# Patient Record
Sex: Male | Born: 1960 | Race: White | Hispanic: No | Marital: Married | Smoking: Former smoker
Health system: Southern US, Community
[De-identification: ages and names within clinical notes are randomized; demographics above are authoritative.]

## PROBLEM LIST (undated history)

## (undated) DIAGNOSIS — Z972 Presence of dental prosthetic device (complete) (partial): Secondary | ICD-10-CM

## (undated) DIAGNOSIS — T753XXA Motion sickness, initial encounter: Secondary | ICD-10-CM

## (undated) HISTORY — PX: COLONOSCOPY: SHX174

## (undated) HISTORY — PX: MOUTH SURGERY: SHX715

---

## 2007-02-05 ENCOUNTER — Emergency Department: Payer: Self-pay | Admitting: Emergency Medicine

## 2007-06-09 ENCOUNTER — Emergency Department: Payer: Self-pay | Admitting: Emergency Medicine

## 2008-06-08 ENCOUNTER — Emergency Department: Payer: Self-pay | Admitting: Emergency Medicine

## 2008-06-08 ENCOUNTER — Other Ambulatory Visit: Payer: Self-pay

## 2009-08-15 ENCOUNTER — Emergency Department: Payer: Self-pay | Admitting: Emergency Medicine

## 2009-08-18 ENCOUNTER — Ambulatory Visit: Payer: Self-pay | Admitting: Internal Medicine

## 2011-08-22 ENCOUNTER — Ambulatory Visit: Payer: Self-pay | Admitting: Family Medicine

## 2012-02-22 ENCOUNTER — Ambulatory Visit: Payer: Self-pay | Admitting: Gastroenterology

## 2012-02-26 LAB — PATHOLOGY REPORT

## 2013-11-05 HISTORY — PX: CARDIAC CATHETERIZATION: SHX172

## 2013-11-17 ENCOUNTER — Observation Stay: Payer: Self-pay | Admitting: Internal Medicine

## 2013-11-17 LAB — LIPID PANEL
CHOLESTEROL: 146 mg/dL (ref 0–200)
HDL: 29 mg/dL — AB (ref 40–60)
Ldl Cholesterol, Calc: 73 mg/dL (ref 0–100)
TRIGLYCERIDES: 219 mg/dL — AB (ref 0–200)
VLDL CHOLESTEROL, CALC: 44 mg/dL — AB (ref 5–40)

## 2013-11-17 LAB — TROPONIN I

## 2013-11-17 LAB — BASIC METABOLIC PANEL
Anion Gap: 2 — ABNORMAL LOW (ref 7–16)
BUN: 8 mg/dL (ref 7–18)
CALCIUM: 8.9 mg/dL (ref 8.5–10.1)
CREATININE: 1.05 mg/dL (ref 0.60–1.30)
Chloride: 110 mmol/L — ABNORMAL HIGH (ref 98–107)
Co2: 30 mmol/L (ref 21–32)
EGFR (African American): >60
GLUCOSE: 95 mg/dL (ref 65–99)
Osmolality: 281 (ref 275–301)
Potassium: 4.2 mmol/L (ref 3.5–5.1)
Sodium: 142 mmol/L (ref 136–145)

## 2013-11-17 LAB — CBC
HCT: 42.8 % (ref 40.0–52.0)
HGB: 14.8 g/dL (ref 13.0–18.0)
MCH: 29.9 pg (ref 26.0–34.0)
MCHC: 34.4 g/dL (ref 32.0–36.0)
MCV: 87 fL (ref 80–100)
PLATELETS: 156 10*3/uL (ref 150–440)
RBC: 4.94 10*6/uL (ref 4.40–5.90)
RDW: 14.4 % (ref 11.5–14.5)
WBC: 5.4 10*3/uL (ref 3.8–10.6)

## 2013-11-17 LAB — CK-MB
CK-MB: 2.5 ng/mL (ref 0.5–3.6)
CK-MB: 1.9 ng/mL (ref 0.5–3.6)
CK-MB: 1.9 ng/mL (ref 0.5–3.6)

## 2013-11-17 LAB — HEMOGLOBIN A1C: HEMOGLOBIN A1C: 5.9 % (ref 4.2–6.3)

## 2013-11-17 LAB — CK: CK, TOTAL: 151 U/L (ref 35–232)

## 2013-11-18 DIAGNOSIS — I209 Angina pectoris, unspecified: Secondary | ICD-10-CM

## 2013-11-18 LAB — BASIC METABOLIC PANEL
Anion Gap: 5 — ABNORMAL LOW (ref 7–16)
BUN: 12 mg/dL (ref 7–18)
CALCIUM: 8.2 mg/dL — AB (ref 8.5–10.1)
CHLORIDE: 108 mmol/L — AB (ref 98–107)
CO2: 27 mmol/L (ref 21–32)
Creatinine: 1.25 mg/dL (ref 0.60–1.30)
EGFR (African American): >60
EGFR (Non-African Amer.): >60
Glucose: 99 mg/dL (ref 65–99)
OSMOLALITY: 279 (ref 275–301)
Potassium: 3.7 mmol/L (ref 3.5–5.1)
SODIUM: 140 mmol/L (ref 136–145)

## 2013-11-18 LAB — CBC WITH DIFFERENTIAL/PLATELET
BASOS PCT: 1.3 %
Basophil #: 0.1 10*3/uL (ref 0.0–0.1)
EOS ABS: 0.2 10*3/uL (ref 0.0–0.7)
Eosinophil %: 4 %
HCT: 40.7 % (ref 40.0–52.0)
HGB: 14.1 g/dL (ref 13.0–18.0)
Lymphocyte #: 1.8 10*3/uL (ref 1.0–3.6)
Lymphocyte %: 32.6 %
MCH: 30.1 pg (ref 26.0–34.0)
MCHC: 34.8 g/dL (ref 32.0–36.0)
MCV: 87 fL (ref 80–100)
Monocyte #: 0.4 x10 3/mm (ref 0.2–1.0)
Monocyte %: 7.9 %
Neutrophil #: 2.9 10*3/uL (ref 1.4–6.5)
Neutrophil %: 54.2 %
Platelet: 143 10*3/uL — ABNORMAL LOW (ref 150–440)
RBC: 4.69 10*6/uL (ref 4.40–5.90)
RDW: 14.6 % — ABNORMAL HIGH (ref 11.5–14.5)
WBC: 5.4 10*3/uL (ref 3.8–10.6)

## 2013-12-03 ENCOUNTER — Ambulatory Visit: Payer: Self-pay | Admitting: Internal Medicine

## 2015-02-26 NOTE — Discharge Summary (Signed)
PATIENT NAME:  Harry Peters, [name] MR#:  [id number] DATE OF BIRTH:  09-14-1961  DATE OF ADMISSION:  11/17/2013 DATE OF DISCHARGE:  11/18/2013  ADMITTING PHYSICIAN: Enid Baasadhika Jarrah Babich, MD  DISCHARGING PHYSICIAN: Enid Baasadhika Ellen Goris, MD  PRIMARY CARE PHYSICIAN: Dr. Burnett ShengHedrick.   CONSULTATIONS IN HOSPITAL: None.   DISCHARGE DIAGNOSES: Atypical chest pain, but could be musculoskeletal.   DISCHARGE MEDICATIONS: None.  DISCHARGE DIET: Regular diet.   DISCHARGE ACTIVITY: As tolerated.    FOLLOWUP INSTRUCTIONS:  PCP follow-up in 2 weeks.   LABS AND IMAGING STUDIES PRIOR TO DISCHARGE: WBC 5.4, hemoglobin 14.1, hematocrit 40.7, platelet count 143.   Sodium 140, potassium 3.7, chloride 108, bicarb 27, BUN 12, creatinine 1.25, glucose 99, calcium 8.2. CK, CK-MB and troponins have been within normal limits.   Chest x-ray on admission showing normal lung fields. No acute cardiopulmonary disease. CT of the chest with IV contrast showing no evidence of aortic aneurysm or dissection. No pulmonary embolus. No infiltrate or edema. Mild degenerative changes in the thoracic spine. No mediastinal hematoma or adenopathy noted.   LDL cholesterol 73, HDL 29, total cholesterol 782146, triglycerides 219. Hemoglobin A1c 5.9.   BRIEF HOSPITAL COURSE: Mr. Harry Peters is an obese 54 year old Caucasian male with no significant past medical history who presents to the hospital secondary to chest pain.   Chest pain: Presented with atypical chest pain with diaphoresis, pressure, radiating down the left arm. Does have family history of heart disease and remote smoking history so was admitted under observation to telemetry. His cardiac enzymes were negative. He had an exercise Myoview done and it showed no significant wall motion abnormality. No ST changes. Normal Lexiscan perfusion. Ejection fraction is 70%. This was deemed to be a normal and low risk scan so he was discharged home. His pain was thought secondary to be  musculoskeletal at that point. The patient was safely discharged home at that point and was advised to follow up with PCP. His course has been otherwise uneventful in the hospital. He did get significant headache with the nitro patch, which was taken off, and he was given some Tylenol for pain.  DISCHARGE CONDITION: Stable.  DISCHARGE DISPOSITION: Home.   TIME SPENT ON DISCHARGE: 40 minutes.  ____________________________ Enid Baasadhika Tierra Divelbiss, MD rk:sb D: 11/19/2013 12:29:20 ET T: 11/19/2013 13:18:33 ET JOB#: [id number]  cc: Enid Baasadhika Shallen Luedke, MD, <Dictator> Rhona LeavensJames F. Burnett ShengHedrick, MD Enid BaasADHIKA Dixie Jafri MD ELECTRONICALLY SIGNED 11/19/2013 14:17
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
## 2015-03-30 ENCOUNTER — Encounter: Payer: Self-pay | Admitting: *Deleted

## 2015-03-30 NOTE — Anesthesia Preprocedure Evaluation (Addendum)
Anesthesia Evaluation  Patient identified by MRN, date of birth, ID band  Reviewed: Allergy & Precautions, H&P , NPO status , Patient's Chart, lab work & pertinent test results  Airway Mallampati: I  TM Distance: >3 FB Neck ROM: full    Dental no notable dental hx. (+) Upper Dentures   Pulmonary former smoker,    Pulmonary exam normal       Cardiovascular Rhythm:regular Rate:Normal  CP 11/2013- cardiac cath done.  Normal LV fxn, no stenosis of coronaries   Neuro/Psych    GI/Hepatic   Endo/Other    Renal/GU      Musculoskeletal   Abdominal   Peds  Hematology   Anesthesia Other Findings   Reproductive/Obstetrics                          Anesthesia Physical Anesthesia Plan  ASA: I  Anesthesia Plan: MAC   Post-op Pain Management:    Induction:   Airway Management Planned:   Additional Equipment:   Intra-op Plan:   Post-operative Plan:   Informed Consent: I have reviewed the patients History and Physical, chart, labs and discussed the procedure including the risks, benefits and alternatives for the proposed anesthesia with the patient or authorized representative who has indicated his/her understanding and acceptance.     Plan Discussed with: CRNA  Anesthesia Plan Comments:         Anesthesia Quick Evaluation

## 2015-04-05 NOTE — Discharge Instructions (Signed)
[facility] °[facility] ° °POST OPERATIVE INSTRUCTIONS FOR DR. TROXLER AND DR. FOWLER °[facility] ° ° °1. Take your medication as prescribed.  Pain medication should be taken only as needed. ° °2. Keep the dressing clean, dry and intact. ° °3. Keep your foot elevated above the heart level for the first 48 hours. ° °4. Walking to the bathroom and brief periods of walking are acceptable, unless we have instructed you to be non-weight bearing. ° °5. Always wear your post-op shoe when walking.  Always use your crutches if you are to be non-weight bearing. ° °6. Do not take a shower. Baths are permissible as long as the foot is kept out of the water.  ° °7. Every hour you are awake:  °- Bend your knee 15 times. °- Flex foot 15 times °- Massage calf 15 times ° °8. Call [facility] ([phone]) if any of the following problems occur: °- You develop a temperature or fever. °- The bandage becomes saturated with blood. °- Medication does not stop your pain. °- Injury of the foot occurs. °- Any symptoms of infection including redness, odor, or red streaks running from wound. °-  ° °General Anesthesia, Care After °Refer to this sheet in the next few weeks. These instructions provide you with information on caring for yourself after your procedure. Your health care provider may also give you more specific instructions. Your treatment has been planned according to current medical practices, but problems sometimes occur. Call your health care provider if you have any problems or questions after your procedure. °WHAT TO EXPECT AFTER THE PROCEDURE °After the procedure, it is typical to experience: °· Sleepiness. °· Nausea and vomiting. °HOME CARE INSTRUCTIONS °· For the first 24 hours after general anesthesia: °¨ Have a responsible person with you. °¨ Do not drive a car. If you are alone, do not take public transportation. °¨ Do not drink alcohol. °¨ Do not take medicine  that has not been prescribed by your health care provider. °¨ Do not sign important papers or make important decisions. °¨ You may resume a normal diet and activities as directed by your health care provider. °· Change bandages (dressings) as directed. °· If you have questions or problems that seem related to general anesthesia, call the hospital and ask for the anesthetist or anesthesiologist on call. °SEEK MEDICAL CARE IF: °· You have nausea and vomiting that continue the day after anesthesia. °· You develop a rash. °SEEK IMMEDIATE MEDICAL CARE IF:  °· You have difficulty breathing. °· You have chest pain. °· You have any allergic problems. °Document Released: 01/28/2001 Document Revised: 10/27/2013 Document Reviewed: 05/07/2013 °ExitCare® Patient Information ©2015 [employer]. This information is not intended to replace advice given to you by your health care provider. Make sure you discuss any questions you have with your health care provider. ° °

## 2015-04-07 ENCOUNTER — Encounter
Admission: RE | Disposition: A | Discharge status: Home / Self Care | Payer: Self-pay | Source: Ambulatory Visit | Attending: Podiatry

## 2015-04-07 ENCOUNTER — Ambulatory Visit: Payer: 59 | Admitting: Anesthesiology

## 2015-04-07 ENCOUNTER — Ambulatory Visit
Admission: RE | Admit: 2015-04-07 | Discharge: 2015-04-07 | Disposition: A | Discharge status: Home / Self Care | Payer: 59 | Source: Ambulatory Visit | Attending: Podiatry | Admitting: Podiatry

## 2015-04-07 DIAGNOSIS — Z880 Allergy status to penicillin: Secondary | ICD-10-CM | POA: Diagnosis not present

## 2015-04-07 DIAGNOSIS — X58XXXA Exposure to other specified factors, initial encounter: Secondary | ICD-10-CM | POA: Insufficient documentation

## 2015-04-07 DIAGNOSIS — S92901A Unspecified fracture of right foot, initial encounter for closed fracture: Secondary | ICD-10-CM | POA: Insufficient documentation

## 2015-04-07 DIAGNOSIS — G8929 Other chronic pain: Secondary | ICD-10-CM | POA: Diagnosis not present

## 2015-04-07 DIAGNOSIS — Z87891 Personal history of nicotine dependence: Secondary | ICD-10-CM | POA: Diagnosis not present

## 2015-04-07 DIAGNOSIS — M25871 Other specified joint disorders, right ankle and foot: Secondary | ICD-10-CM | POA: Insufficient documentation

## 2015-04-07 HISTORY — DX: Motion sickness, initial encounter: T75.3XXA

## 2015-04-07 HISTORY — DX: Presence of dental prosthetic device (complete) (partial): Z97.2

## 2015-04-07 HISTORY — PX: SESAMOIDECTOMY: SHX6418

## 2015-04-07 SURGERY — EXCISION, SESAMOID BONE
Anesthesia: Monitor Anesthesia Care | Site: Foot | Laterality: Right | Wound class: Clean

## 2015-04-07 MED ORDER — FENTANYL CITRATE (PF) 100 MCG/2ML IJ SOLN
INTRAMUSCULAR | Status: DC | PRN
  Administered 2015-04-07: 25 ug via INTRAVENOUS
  Administered 2015-04-07: 50 ug via INTRAVENOUS
  Administered 2015-04-07: 25 ug via INTRAVENOUS

## 2015-04-07 MED ORDER — BUPIVACAINE HCL (PF) 0.5 % IJ SOLN
INTRAMUSCULAR | Status: DC | PRN
  Administered 2015-04-07: 10 mL

## 2015-04-07 MED ORDER — OXYCODONE-ACETAMINOPHEN 7.5-325 MG PO TABS: 1.0000 | ORAL_TABLET | ORAL | Status: DC | PRN

## 2015-04-07 MED ORDER — PROPOFOL INFUSION 10 MG/ML OPTIME
INTRAVENOUS | Status: DC | PRN
  Administered 2015-04-07: 200 ug/kg/min via INTRAVENOUS

## 2015-04-07 MED ORDER — FENTANYL CITRATE (PF) 100 MCG/2ML IJ SOLN: 25.0000 ug | INTRAMUSCULAR | Status: DC | PRN

## 2015-04-07 MED ORDER — LACTATED RINGERS IV SOLN
INTRAVENOUS | Status: DC
  Administered 2015-04-07 (×2): via INTRAVENOUS

## 2015-04-07 MED ORDER — MIDAZOLAM HCL 2 MG/2ML IJ SOLN
INTRAMUSCULAR | Status: DC | PRN
  Administered 2015-04-07: 2 mg via INTRAVENOUS

## 2015-04-07 MED ORDER — BUPIVACAINE-EPINEPHRINE (PF) 0.5% -1:200000 IJ SOLN
INTRAMUSCULAR | Status: DC | PRN
  Administered 2015-04-07: 5 mL

## 2015-04-07 MED ORDER — CLINDAMYCIN PHOSPHATE 600 MG/50ML IV SOLN
600.0000 mg | Freq: Once | INTRAVENOUS | Status: AC
  Administered 2015-04-07: 600 mg via INTRAVENOUS

## 2015-04-07 MED ORDER — ONDANSETRON HCL 4 MG/2ML IJ SOLN: 4.0000 mg | Freq: Once | INTRAMUSCULAR | Status: DC | PRN

## 2015-04-07 MED ORDER — HYDROCODONE-ACETAMINOPHEN 7.5-325 MG PO TABS: 1.0000 | ORAL_TABLET | Freq: Once | ORAL | Status: DC | PRN

## 2015-04-07 SURGICAL SUPPLY — 64 items
BANDAGE ELASTIC 4 CLIP NS LF (GAUZE/BANDAGES/DRESSINGS) ×2 IMPLANT
BENZOIN TINCTURE PRP APPL 2/3 (GAUZE/BANDAGES/DRESSINGS) ×2 IMPLANT
BLADE CRESCENTIC (BLADE) IMPLANT
BLADE MED AGGRESSIVE (BLADE) IMPLANT
BLADE MINI RND TIP GREEN BEAV (BLADE) IMPLANT
BLADE OSC/SAGITTAL 5.5X25 (BLADE) IMPLANT
BLADE OSC/SAGITTAL MD 5.5X18 (BLADE) IMPLANT
BLADE OSC/SAGITTAL MD 9X18.5 (BLADE) IMPLANT
BLADE OSCILLATING/SAGITTAL (BLADE)
BLADE SURG 15 STRL LF DISP TIS (BLADE) IMPLANT
BLADE SURG 15 STRL SS (BLADE)
BLADE SW THK.38XMED LNG THN (BLADE) IMPLANT
BNDG ESMARK 6X12 TAN STRL LF (GAUZE/BANDAGES/DRESSINGS) ×2 IMPLANT
BNDG GAUZE 4.5X4.1 6PLY STRL (MISCELLANEOUS) ×2 IMPLANT
BNDG STRETCH 4X75 STRL LF (GAUZE/BANDAGES/DRESSINGS) ×2 IMPLANT
BUR EGG 4X8 MED (BURR) IMPLANT
BUR STRYKR EGG 5.0 (BURR) IMPLANT
CANISTER SUCT 1200ML W/VALVE (MISCELLANEOUS) ×2 IMPLANT
CAST PADDING 3X4FT ST 30246 (SOFTGOODS)
COVER PIN YLW 0.028-062 (MISCELLANEOUS) IMPLANT
CUFF TOURN SGL QUICK 18 (TOURNIQUET CUFF) IMPLANT
CUFF TOURNIQUET DUAL PORT 18X3 (MISCELLANEOUS) ×2 IMPLANT
DRAPE FLUOR MINI C-ARM 54X84 (DRAPES) ×2 IMPLANT
DRILL WIRE PASS (DRILL) IMPLANT
DURAPREP 26ML APPLICATOR (WOUND CARE) ×2 IMPLANT
ETHIBOND 2 0 GREEN CT 2 30IN (SUTURE) IMPLANT
GAUZE PETRO XEROFOAM 1X8 (MISCELLANEOUS) ×2 IMPLANT
GAUZE PETRO XEROFOAM 5X9 (MISCELLANEOUS) IMPLANT
GAUZE SPONGE 4X4 12PLY STRL (GAUZE/BANDAGES/DRESSINGS) ×2 IMPLANT
GLOVE BIO SURGEON STRL SZ7.5 (GLOVE) ×2 IMPLANT
GLOVE BIO SURGEON STRL SZ8 (GLOVE) ×2 IMPLANT
GLOVE INDICATOR 8.0 STRL GRN (GLOVE) ×2 IMPLANT
GOWN STRL REUS W/ TWL XL LVL3 (GOWN DISPOSABLE) ×2 IMPLANT
GOWN STRL REUS W/TWL XL LVL3 (GOWN DISPOSABLE) ×2
K-WIRE DBL END TROCAR 6X.045 (WIRE)
K-WIRE DBL END TROCAR 6X.062 (WIRE)
KWIRE DBL END TROCAR 6X.045 (WIRE) IMPLANT
KWIRE DBL END TROCAR 6X.062 (WIRE) IMPLANT
NEEDLE HYPO 18GX1.5 BLUNT FILL (NEEDLE) IMPLANT
NEEDLE HYPO 25GX1X1/2 BEV (NEEDLE) ×2 IMPLANT
PACK EXTREMITY ARMC (MISCELLANEOUS) ×2 IMPLANT
PAD CAST CTTN 3X4 STRL (SOFTGOODS) IMPLANT
PAD GROUND ADULT SPLIT (MISCELLANEOUS) ×2 IMPLANT
RASP SM TEAR CROSS CUT (RASP) IMPLANT
SPLINT CAST 1 STEP 4X30 (MISCELLANEOUS) IMPLANT
SPLINT FAST PLASTER 5X30 (CAST SUPPLIES)
SPLINT PLASTER CAST FAST 5X30 (CAST SUPPLIES) IMPLANT
STOCKINETTE STRL 6IN 960660 (GAUZE/BANDAGES/DRESSINGS) ×2 IMPLANT
STRIP CLOSURE SKIN 1/4X4 (GAUZE/BANDAGES/DRESSINGS) ×2 IMPLANT
SUT ETHILON 4-0 (SUTURE)
SUT ETHILON 4-0 FS2 18XMFL BLK (SUTURE)
SUT ETHILON 5-0 FS-2 18 BLK (SUTURE) IMPLANT
SUT VIC AB 1 CT1 36 (SUTURE) IMPLANT
SUT VIC AB 2-0 CT1 27 (SUTURE)
SUT VIC AB 2-0 CT1 TAPERPNT 27 (SUTURE) IMPLANT
SUT VIC AB 2-0 SH 27 (SUTURE)
SUT VIC AB 2-0 SH 27XBRD (SUTURE) IMPLANT
SUT VIC AB 3-0 SH 27 (SUTURE)
SUT VIC AB 3-0 SH 27X BRD (SUTURE) IMPLANT
SUT VIC AB 4-0 FS2 27 (SUTURE) IMPLANT
SUT VICRYL AB 3-0 FS1 BRD 27IN (SUTURE) ×2 IMPLANT
SUTURE ETHLN 4-0 FS2 18XMF BLK (SUTURE) IMPLANT
SYRINGE 10CC LL (SYRINGE) ×2 IMPLANT
WATER STERILE IRR 1000ML POUR (IV SOLUTION) ×2 IMPLANT

## 2015-04-07 NOTE — Anesthesia Procedure Notes (Signed)
Procedure Name: MAC Date/Time: 04/07/2015 7:39 AM Performed by: Maree KrabbeWARREN, Buel Molder Pre-anesthesia Checklist: Patient identified, Emergency Drugs available, Suction available, Patient being monitored and Timeout performed Patient Re-evaluated:Patient Re-evaluated prior to inductionOxygen Delivery Method: Simple face mask Placement Confirmation: positive ETCO2 and breath sounds checked- equal and bilateral

## 2015-04-07 NOTE — Transfer of Care (Signed)
Immediate Anesthesia Transfer of Care Note  Patient: Morrell RiddleJames W Lautner  Procedure(s) Performed: Procedure(s): SESAMOIDECTOMY (Right)  Patient Location: PACU  Anesthesia Type: MAC  Level of Consciousness: awake, alert  and patient cooperative  Airway and Oxygen Therapy: Patient Spontanous Breathing and Patient connected to supplemental oxygen  Post-op Assessment: Post-op Vital signs reviewed, Patient's Cardiovascular Status Stable, Respiratory Function Stable, Patent Airway and No signs of Nausea or vomiting  Post-op Vital Signs: Reviewed and stable  Complications: No apparent anesthesia complications

## 2015-04-07 NOTE — Anesthesia Postprocedure Evaluation (Signed)
  Anesthesia Post-op Note  Patient: Harry Peters  Procedure(s) Performed: Procedure(s): SESAMOIDECTOMY (Right)  Anesthesia type:MAC  Patient location: PACU  Post pain: Pain level controlled  Post assessment: Post-op Vital signs reviewed, Patient's Cardiovascular Status Stable, Respiratory Function Stable, Patent Airway and No signs of Nausea or vomiting  Post vital signs: Reviewed and stable  Last Vitals:  Filed Vitals:   04/07/15 0900  BP: 111/78  Pulse: 58  Temp:   Resp: 13    Level of consciousness: awake, alert  and patient cooperative  Complications: No apparent anesthesia complications
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
## 2015-04-08 ENCOUNTER — Encounter: Payer: Self-pay | Admitting: Podiatry

## 2015-04-11 LAB — SURGICAL PATHOLOGY

## 2015-12-07 ENCOUNTER — Encounter: Payer: Self-pay | Admitting: Emergency Medicine

## 2015-12-07 ENCOUNTER — Emergency Department: Payer: Worker's Compensation

## 2015-12-07 ENCOUNTER — Ambulatory Visit
Admission: EM | Admit: 2015-12-07 | Discharge: 2015-12-07 | Disposition: A | Discharge status: Other Acute Inpatient Hospital | Payer: Worker's Compensation | Attending: Family Medicine | Admitting: Family Medicine

## 2015-12-07 ENCOUNTER — Emergency Department
Admission: EM | Admit: 2015-12-07 | Discharge: 2015-12-07 | Disposition: A | Discharge status: Home / Self Care | Payer: Worker's Compensation | Attending: Emergency Medicine | Admitting: Emergency Medicine

## 2015-12-07 DIAGNOSIS — J705 Respiratory conditions due to smoke inhalation: Secondary | ICD-10-CM | POA: Insufficient documentation

## 2015-12-07 DIAGNOSIS — J689 Unspecified respiratory condition due to chemicals, gases, fumes and vapors: Secondary | ICD-10-CM

## 2015-12-07 DIAGNOSIS — T59811A Toxic effect of smoke, accidental (unintentional), initial encounter: Secondary | ICD-10-CM

## 2015-12-07 DIAGNOSIS — R531 Weakness: Secondary | ICD-10-CM | POA: Insufficient documentation

## 2015-12-07 DIAGNOSIS — Z87891 Personal history of nicotine dependence: Secondary | ICD-10-CM | POA: Insufficient documentation

## 2015-12-07 DIAGNOSIS — Z880 Allergy status to penicillin: Secondary | ICD-10-CM | POA: Diagnosis not present

## 2015-12-07 LAB — COMPREHENSIVE METABOLIC PANEL
ALK PHOS: 81 U/L (ref 38–126)
ALT: 19 U/L (ref 17–63)
AST: 21 U/L (ref 15–41)
Albumin: 4.3 g/dL (ref 3.5–5.0)
Anion gap: 6 (ref 5–15)
BILIRUBIN TOTAL: 0.7 mg/dL (ref 0.3–1.2)
BUN: 9 mg/dL (ref 6–20)
CALCIUM: 9 mg/dL (ref 8.9–10.3)
CO2: 28 mmol/L (ref 22–32)
CREATININE: 1.05 mg/dL (ref 0.61–1.24)
Chloride: 104 mmol/L (ref 101–111)
Glucose, Bld: 106 mg/dL — ABNORMAL HIGH (ref 65–99)
Potassium: 3.7 mmol/L (ref 3.5–5.1)
Sodium: 138 mmol/L (ref 135–145)
TOTAL PROTEIN: 7 g/dL (ref 6.5–8.1)

## 2015-12-07 LAB — CBC WITH DIFFERENTIAL/PLATELET
Basophils Absolute: 0.1 10*3/uL (ref 0–0.1)
Basophils Absolute: 0.1 10*3/uL (ref 0–0.1)
Basophils Relative: 1 %
Basophils Relative: 1 %
EOS ABS: 0.2 10*3/uL (ref 0–0.7)
EOS PCT: 4 %
EOS PCT: 4 %
Eosinophils Absolute: 0.2 10*3/uL (ref 0–0.7)
HCT: 46 % (ref 40.0–52.0)
HEMATOCRIT: 44.2 % (ref 40.0–52.0)
HEMOGLOBIN: 14.9 g/dL (ref 13.0–18.0)
HEMOGLOBIN: 15.5 g/dL (ref 13.0–18.0)
LYMPHS ABS: 1.4 10*3/uL (ref 1.0–3.6)
LYMPHS ABS: 1.5 10*3/uL (ref 1.0–3.6)
LYMPHS PCT: 26 %
LYMPHS PCT: 27 %
MCH: 29.4 pg (ref 26.0–34.0)
MCH: 29.2 pg (ref 26.0–34.0)
MCHC: 33.8 g/dL (ref 32.0–36.0)
MCHC: 33.8 g/dL (ref 32.0–36.0)
MCV: 87 fL (ref 80.0–100.0)
MCV: 86.5 fL (ref 80.0–100.0)
MONOS PCT: 6 %
Monocytes Absolute: 0.3 10*3/uL (ref 0.2–1.0)
Monocytes Absolute: 0.3 10*3/uL (ref 0.2–1.0)
Monocytes Relative: 6 %
NEUTROS ABS: 3.2 10*3/uL (ref 1.4–6.5)
Neutro Abs: 3.5 10*3/uL (ref 1.4–6.5)
Neutrophils Relative %: 63 %
Neutrophils Relative %: 62 %
PLATELETS: 156 10*3/uL (ref 150–440)
PLATELETS: 160 10*3/uL (ref 150–440)
RBC: 5.07 MIL/uL (ref 4.40–5.90)
RBC: 5.32 MIL/uL (ref 4.40–5.90)
RDW: 14.4 % (ref 11.5–14.5)
RDW: 14.4 % (ref 11.5–14.5)
WBC: 5.2 10*3/uL (ref 3.8–10.6)
WBC: 5.6 10*3/uL (ref 3.8–10.6)

## 2015-12-07 LAB — BASIC METABOLIC PANEL
Anion gap: 8 (ref 5–15)
BUN: 9 mg/dL (ref 6–20)
CHLORIDE: 110 mmol/L (ref 101–111)
CO2: 25 mmol/L (ref 22–32)
CREATININE: 1 mg/dL (ref 0.61–1.24)
Calcium: 9.2 mg/dL (ref 8.9–10.3)
GFR calc Af Amer: >60 mL/min (ref >60)
GFR calc non Af Amer: >60 mL/min (ref >60)
GLUCOSE: 100 mg/dL — AB (ref 65–99)
POTASSIUM: 3.9 mmol/L (ref 3.5–5.1)
SODIUM: 143 mmol/L (ref 135–145)

## 2015-12-07 MED ORDER — ONDANSETRON 8 MG PO TBDP
8.0000 mg | ORAL_TABLET | Freq: Once | ORAL | Status: AC
  Administered 2015-12-07: 8 mg via ORAL

## 2015-12-07 MED ORDER — ONDANSETRON HCL 4 MG/2ML IJ SOLN: 8.0000 mg | Freq: Once | INTRAMUSCULAR | Status: DC
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
## 2015-12-07 NOTE — ED Notes (Signed)
Patient transported to X-ray 

## 2015-12-07 NOTE — ED Notes (Signed)
EMS called to transport patient to [facility] ED 
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
## 2015-12-07 NOTE — ED Notes (Signed)
UDS collected for [employer].
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
## 2015-12-07 NOTE — Discharge Instructions (Signed)
Smoke Inhalation, Mild  Smoke inhalation means that you have breathed in smoke. Exposure to hot smoke from a fire can damage all parts of your airway including your nose, mouth, throat (trachea), and lungs. If you received a burn injury on the outside of your body from a fire, you are also at risk of having a smoke inhalation injury in your airways.  SIGNS AND SYMPTOMS  The symptoms of smoke inhalation injury are often delayed for up to a day after exposure and usually improve quickly. Symptoms may include:   Sore throat.   Cough, including coughing up black material that looks burnt (carbonaceous sputum).   Wheezing or abnormal noises when you inhale (stridor).   Chest pain.   Trouble breathing.  RISK FACTORS  Patients with chronic lung disease or a history of alcohol abuse are at higher risk for serious complications from smoke inhalation.  DIAGNOSIS  Your health care provider may suspect smoke inhalation injury based on the history of exposure, symptoms, and physical findings. Your health care provider may perform other tests such as:   Chest X-ray exams or CT scans.   Inspection of your airway (laryngoscopy or bronchoscopy).   Blood tests.  Further medical evaluation and hospital care may be needed if your symptoms get worse over the next 1-2 days.  TREATMENT  If you have breathing difficulty from the smoke inhalation, you may be admitted to the hospital for overnight observation. If severe breathing trouble develops, a breathing tube may be needed to help you breathe.You also may be treated with supplemental oxygen therapy.  HOME CARE INSTRUCTIONS   Do not return to the area of the fire until the proper authorities tell you it is safe.   Do not smoke.   Do not drink alcohol until approved by your health care provider.   Drink enough water and fluids to keep your urine clear or pale yellow.   Get plenty of rest for the next 2-3 days.   Only take over-the-counter or prescription medicines for pain,  fever, or discomfort as directed by your health care provider.   Follow up with your health care provider as directed.  SEEK IMMEDIATE MEDICAL CARE IF:    You have wheezing, difficulty breathing, a continuous cough, or increased spit.   You have severe chest pain or headache.   You have nausea or vomiting.   You have shortness of breath with your usual activities. Your heart seems to beat too fast with minimal exercise.   You become confused, irritable, or unusually sleepy.   You experience dizziness.   You develop any breathing problems that are worsening rather than improving.     This information is not intended to replace advice given to you by your health care provider. Make sure you discuss any questions you have with your health care provider.     Document Released: 10/19/2000 Document Revised: 08/12/2013 Document Reviewed: 05/26/2013  Elsevier Interactive Patient Education 2016 [employer]

## 2015-12-08 LAB — CARBOXYHEMOGLOBIN
CARBOXYHEMOGLOBIN: 2.3 % (ref 1.5–9.0)
METHEMOGLOBIN: 0.6 %
Total oxygen content: 73 mL/dL

## 2015-12-08 LAB — CARBON MONOXIDE, BLOOD (PERFORMED AT REF LAB): CARBON MONOXIDE, BLOOD: 2.7 % — AB (ref 0.0–1.9)

## 2015-12-18 ENCOUNTER — Emergency Department
Admission: EM | Admit: 2015-12-18 | Discharge: 2015-12-18 | Disposition: A | Discharge status: Home / Self Care | Payer: 59 | Attending: Student | Admitting: Student

## 2015-12-18 ENCOUNTER — Encounter: Payer: Self-pay | Admitting: Emergency Medicine

## 2015-12-18 ENCOUNTER — Emergency Department: Payer: 59

## 2015-12-18 DIAGNOSIS — R109 Unspecified abdominal pain: Secondary | ICD-10-CM | POA: Diagnosis present

## 2015-12-18 DIAGNOSIS — Z880 Allergy status to penicillin: Secondary | ICD-10-CM | POA: Diagnosis not present

## 2015-12-18 DIAGNOSIS — N200 Calculus of kidney: Secondary | ICD-10-CM | POA: Insufficient documentation

## 2015-12-18 DIAGNOSIS — Z87891 Personal history of nicotine dependence: Secondary | ICD-10-CM | POA: Diagnosis not present

## 2015-12-18 DIAGNOSIS — R52 Pain, unspecified: Secondary | ICD-10-CM

## 2015-12-18 LAB — URINALYSIS COMPLETE WITH MICROSCOPIC (ARMC ONLY)
BILIRUBIN URINE: NEGATIVE
Bacteria, UA: NONE SEEN
GLUCOSE, UA: NEGATIVE mg/dL
KETONES UR: NEGATIVE mg/dL
Leukocytes, UA: NEGATIVE
Nitrite: NEGATIVE
PH: 5 (ref 5.0–8.0)
Protein, ur: NEGATIVE mg/dL
Specific Gravity, Urine: 1.024 (ref 1.005–1.030)

## 2015-12-18 LAB — CBC
HCT: 43.9 % (ref 40.0–52.0)
Hemoglobin: 15.1 g/dL (ref 13.0–18.0)
MCH: 30.1 pg (ref 26.0–34.0)
MCHC: 34.3 g/dL (ref 32.0–36.0)
MCV: 87.8 fL (ref 80.0–100.0)
PLATELETS: 163 10*3/uL (ref 150–440)
RBC: 5 MIL/uL (ref 4.40–5.90)
RDW: 14.2 % (ref 11.5–14.5)
WBC: 6.7 10*3/uL (ref 3.8–10.6)

## 2015-12-18 LAB — BASIC METABOLIC PANEL
Anion gap: 8 (ref 5–15)
BUN: 15 mg/dL (ref 6–20)
CALCIUM: 9.1 mg/dL (ref 8.9–10.3)
CO2: 24 mmol/L (ref 22–32)
CREATININE: 1.39 mg/dL — AB (ref 0.61–1.24)
Chloride: 110 mmol/L (ref 101–111)
GFR calc non Af Amer: 57 mL/min — ABNORMAL LOW (ref >60)
Glucose, Bld: 157 mg/dL — ABNORMAL HIGH (ref 65–99)
Potassium: 4.2 mmol/L (ref 3.5–5.1)
SODIUM: 142 mmol/L (ref 135–145)

## 2015-12-18 MED ORDER — ONDANSETRON HCL 4 MG/2ML IJ SOLN
INTRAMUSCULAR | Status: AC
  Administered 2015-12-18: 4 mg via INTRAVENOUS
  Filled 2015-12-18: qty 2

## 2015-12-18 MED ORDER — OXYCODONE HCL 5 MG PO TABS: 5.0000 mg | ORAL_TABLET | Freq: Four times a day (QID) | ORAL | Status: DC | PRN

## 2015-12-18 MED ORDER — ONDANSETRON HCL 4 MG/2ML IJ SOLN
4.0000 mg | Freq: Once | INTRAMUSCULAR | Status: AC
  Administered 2015-12-18: 4 mg via INTRAVENOUS

## 2015-12-18 MED ORDER — MORPHINE SULFATE (PF) 4 MG/ML IV SOLN
INTRAVENOUS | Status: AC
  Administered 2015-12-18: 4 mg via INTRAVENOUS
  Filled 2015-12-18: qty 1

## 2015-12-18 MED ORDER — ONDANSETRON 4 MG PO TBDP: 4.0000 mg | ORAL_TABLET | Freq: Three times a day (TID) | ORAL | Status: DC | PRN

## 2015-12-18 MED ORDER — KETOROLAC TROMETHAMINE 30 MG/ML IJ SOLN
15.0000 mg | Freq: Once | INTRAMUSCULAR | Status: AC
  Administered 2015-12-18: 15 mg via INTRAVENOUS

## 2015-12-18 MED ORDER — MORPHINE SULFATE (PF) 4 MG/ML IV SOLN
4.0000 mg | Freq: Once | INTRAVENOUS | Status: AC
  Administered 2015-12-18: 4 mg via INTRAVENOUS

## 2015-12-18 MED ORDER — TAMSULOSIN HCL 0.4 MG PO CAPS: 0.4000 mg | ORAL_CAPSULE | Freq: Every day | ORAL | Status: DC

## 2015-12-18 MED ORDER — KETOROLAC TROMETHAMINE 30 MG/ML IJ SOLN
INTRAMUSCULAR | Status: AC
  Administered 2015-12-18: 15 mg via INTRAVENOUS
  Filled 2015-12-18: qty 1

## 2015-12-18 NOTE — ED Notes (Signed)
Pt states awoke at 0100 with "the worst pain of my life" to right flank and right lower quadrant with emesis. Ems gave pt fentanly and  zofran. Pt pale, moaning in pain.

## 2015-12-18 NOTE — ED Provider Notes (Signed)
[facility] Emergency Department Provider Note  ____________________________________________  Time seen: Approximately 5:05 AM  I have reviewed the triage vital signs and the nursing notes.   HISTORY  Chief Complaint Flank Pain    HPI Harry Peters is a 55 y.o. male with no chronic medical problems who presents for evaluation of sudden onset right flank pain at 1 AM this morning, constant since onset, severe, no modifying factors and associated with nonbloody nonbilious emesis. No diarrhea, no fevers, no chest pain. No dysuria or hematuria. He has never had this pain before.   Past Medical History  Diagnosis Date  . Motion sickness     Rough seas  . Wears dentures     full upper, partial lower    There are no active problems to display for this patient.   Past Surgical History  Procedure Laterality Date  . Cardiac catheterization  11/2013    Neg - [facility]  . Mouth surgery    . Colonoscopy    . Sesamoidectomy Right 04/07/2015    Procedure: SESAMOIDECTOMY;  Surgeon: Recardo Evangelist, DPM;  Location: [facility];  Service: Podiatry;  Laterality: Right;    Current Outpatient Rx  Name  Route  Sig  Dispense  Refill  . oxyCODONE-acetaminophen (PERCOCET) 7.5-325 MG per tablet   Oral   Take 1 tablet by mouth every 4 (four) hours as needed for severe pain. Patient not taking: Reported on 12/18/2015   30 tablet   0     Allergies Penicillins  History reviewed. No pertinent family history.  Social History Social History  Substance Use Topics  . Smoking status: Former Smoker    Quit date: 11/05/2005  . Smokeless tobacco: Never Used  . Alcohol Use: 1.2 oz/week    2 Shots of liquor per week    Review of Systems Constitutional: No fever/chills Eyes: No visual changes. ENT: No sore throat. Cardiovascular: Denies chest pain. Respiratory: Denies shortness of breath. Gastrointestinal: No abdominal pain.  + nausea, + vomiting.  No  diarrhea.  No constipation. Genitourinary: Negative for dysuria. Musculoskeletal: Positive for right flank pain. Skin: Negative for rash. Neurological: Negative for headaches, focal weakness or numbness.  10-point ROS otherwise negative.  ____________________________________________   PHYSICAL EXAM:  VITAL SIGNS: ED Triage Vitals  Enc Vitals Group     BP 12/18/15 0428 159/103 mmHg     Pulse Rate 12/18/15 0428 54     Resp 12/18/15 0428 24     Temp 12/18/15 0428 97.9 F (36.6 C)     Temp Source 12/18/15 0428 Oral     SpO2 12/18/15 0428 100 %     Weight 12/18/15 0428 294 lb (133.358 kg)     Height 12/18/15 0428  (1.88 m)     Head Cir --      Peak Flow --      Pain Score 12/18/15 0429 10     Pain Loc --      Pain Edu? --      Excl. in GC? --     Constitutional: Alert and oriented. In Distress secondary to pain. Eyes: Conjunctivae are normal. PERRL. EOMI. Head: Atraumatic. Nose: No congestion/rhinnorhea. Mouth/Throat: Mucous membranes are moist.  Oropharynx non-erythematous. Neck: No stridor.  Cardiovascular: Normal rate, regular rhythm. Grossly normal heart sounds.  Good peripheral circulation. Respiratory: Normal respiratory effort.  No retractions. Lungs CTAB. Gastrointestinal: Soft and nontender. No distention.  No CVA tenderness. Genitourinary: deferred Musculoskeletal: No lower extremity tenderness nor edema.  No joint effusions. Neurologic:  Normal speech and language. No gross focal neurologic deficits are appreciated. Skin:  Skin is warm, dry and intact. No rash noted. Psychiatric: Mood and affect are normal. Speech and behavior are normal.  ____________________________________________   LABS (all labs ordered are listed, but only abnormal results are displayed)  Labs Reviewed  BASIC METABOLIC PANEL - Abnormal; Notable for the following:    Glucose, Bld 157 (*)    Creatinine, Ser 1.39 (*)    GFR calc non Af Amer 57 (*)    All other components within  normal limits  URINALYSIS COMPLETEWITH MICROSCOPIC ([facility] ONLY) - Abnormal; Notable for the following:    Color, Urine YELLOW (*)    APPearance CLEAR (*)    Hgb urine dipstick 3+ (*)    Squamous Epithelial / LPF 0-5 (*)    All other components within normal limits  CBC   ____________________________________________  EKG  none ____________________________________________  RADIOLOGY  CT abdomen and pelvis IMPRESSION: 2 mm RIGHT ureterovesicular junction calculus resulting in mild obstructive uropathy. Punctate residual RIGHT interpolar nephrolithiasis.  Normal appendix ____________________________________________   PROCEDURES  Procedure(s) performed: None  Critical Care performed: No  ____________________________________________   INITIAL IMPRESSION / ASSESSMENT AND PLAN / ED COURSE  Pertinent labs & imaging results that were available during my care of the patient were reviewed by me and considered in my medical decision making (see chart for details).  Harry Peters is a 55 y.o. male with no chronic medical problems who presents for evaluation of sudden onset right flank pain at 1 AM this morning. Then, he is in distress due to pain. He is afebrile. Mild tachypnea which I attribute to pain, the remainder of his vital signs are stable, he is afebrile. Labs reviewed. BMP with mild creatinine elevation at 1.39, IV fluids ordered. CBC unremarkable. Urinalysis with numerous red blood cells. Suspect kidney stone with no history of same. Awaiting CT of the abdomen and pelvis, we'll treat his pain.  ----------------------------------------- 5:55 AM on 12/18/2015 -----------------------------------------  CT of the abdomen and pelvis shows 2 mm right UVJ stone.  UA not consistent with infected stone. Patient with significant improvement of his pain at this time. He is tolerating by mouth intake and appears quite comfortable. Discussed return precautions, need for close  urology follow-up. We'll DC with expectant management. The patient and his wife at bedside with the discharge plan. ____________________________________________   FINAL CLINICAL IMPRESSION(S) / ED DIAGNOSES  Final diagnoses:  Kidney stone      Gayla Doss, MD 12/18/15 [phone]

## 2019-12-14 ENCOUNTER — Emergency Department: Attending: Emergency Medicine

## 2019-12-14 ENCOUNTER — Emergency Department
Admission: EM | Admit: 2019-12-14 | Discharge: 2019-12-14 | Disposition: A | Discharge status: Home / Self Care | Attending: Emergency Medicine | Admitting: Emergency Medicine

## 2019-12-14 ENCOUNTER — Other Ambulatory Visit: Payer: Self-pay

## 2019-12-14 DIAGNOSIS — S30810A Abrasion of lower back and pelvis, initial encounter: Secondary | ICD-10-CM | POA: Diagnosis not present

## 2019-12-14 DIAGNOSIS — Y9389 Activity, other specified: Secondary | ICD-10-CM | POA: Insufficient documentation

## 2019-12-14 DIAGNOSIS — S20419A Abrasion of unspecified back wall of thorax, initial encounter: Secondary | ICD-10-CM

## 2019-12-14 DIAGNOSIS — Y990 Civilian activity done for income or pay: Secondary | ICD-10-CM | POA: Insufficient documentation

## 2019-12-14 DIAGNOSIS — W19XXXA Unspecified fall, initial encounter: Secondary | ICD-10-CM

## 2019-12-14 DIAGNOSIS — G8911 Acute pain due to trauma: Secondary | ICD-10-CM

## 2019-12-14 DIAGNOSIS — S300XXA Contusion of lower back and pelvis, initial encounter: Secondary | ICD-10-CM | POA: Diagnosis not present

## 2019-12-14 DIAGNOSIS — M545 Low back pain, unspecified: Secondary | ICD-10-CM

## 2019-12-14 DIAGNOSIS — S3992XA Unspecified injury of lower back, initial encounter: Secondary | ICD-10-CM | POA: Diagnosis present

## 2019-12-14 DIAGNOSIS — W11XXXA Fall on and from ladder, initial encounter: Secondary | ICD-10-CM | POA: Diagnosis not present

## 2019-12-14 DIAGNOSIS — Y929 Unspecified place or not applicable: Secondary | ICD-10-CM | POA: Insufficient documentation

## 2019-12-14 MED ORDER — OXYCODONE-ACETAMINOPHEN 5-325 MG PO TABS: 1.0000 | ORAL_TABLET | ORAL | 0 refills | Status: DC | PRN

## 2019-12-14 MED ORDER — IOHEXOL 300 MG/ML  SOLN
100.0000 mL | Freq: Once | INTRAMUSCULAR | Status: AC | PRN
  Administered 2019-12-14: 06:00:00 100 mL via INTRAVENOUS

## 2019-12-14 MED ORDER — OXYCODONE HCL 5 MG PO TABS
5.0000 mg | ORAL_TABLET | Freq: Once | ORAL | Status: AC
  Administered 2019-12-14: 5 mg via ORAL
  Filled 2019-12-14: qty 1

## 2019-12-14 MED ORDER — ACETAMINOPHEN 500 MG PO TABS
1000.0000 mg | ORAL_TABLET | Freq: Once | ORAL | Status: AC
  Administered 2019-12-14: 1000 mg via ORAL
  Filled 2019-12-14: qty 2

## 2019-12-14 NOTE — Discharge Instructions (Addendum)
You were seen in the emergency department after a fall. Luckily all of your imaging studies did not show any evidence of injuries. Follow-up with you doctor within the next 2-3 days for further evaluation. Sometimes injuries can present at a later time and therefore it is imperative that you return to the emergency room if you have a severe headache, facial droop, neck pain, numbness or weakness of your extremities, slurred speech, difficulty finding words, chest pain, back pain, abdominal pain, or any other new symptoms that were not present during this visit. You may take Tylenol at home for your pain.   You have been seen in the Emergency Department (ED)  today for back pain.  Back pain has many possible causes some are related to muscles while others have more serious causes. Even though you were checked carefully today and your exam and evaluation were reassuring, problems may develop later or continue to unfold. Therefore it is imperative that you follow up with doctor closely for further evaluation.  Follow-up with your doctor in 1 day for further evaluation.  For pain control take: percocet as prescribed  CT scans:  1. No CT evidence of acute vertebral body fracture or traumatic  malalignment.  2. Discogenic changes most pronounced at L5-S1 with at most mild  canal stenosis and foraminal narrowing.  3. Small amount of posterior soft tissue contusive changes to the  left of midline are better assessed on the wider field of view  images in the CT of the abdomen and pelvis from which this study is  reconstructed. Please see dedicated CT for further details.  1. Soft tissue stranding and contusive changes in the posterior abdominal soft tissues to the left of midline. No large hematoma or evidence of active contrast extravasation. 2. Hepatic steatosis. 3. Few scattered distal colonic diverticula without evidence of acute diverticulitis. 4. Aortic Atherosclerosis (ICD10-I70.0). 5.  Dedicated lumbar spine CT reconstructions were generated and dictated separately, please see report for further details.  When should you call for help?  Call your doctor now or seek immediate medical care if:  You have new or worsening numbness in your legs.  You have new or worsening weakness in your legs. (This could make it hard to stand up.)  You lose control of your bladder or bowels or if you are unable to urinate. You have numbness of your groin or buttock region If you develop a fever  Watch closely for changes in your health, and be sure to contact your doctor if:  Your pain gets worse.  You are not getting better after 2 weeks.  How can you care for yourself at home?  Take pain medicines exactly as directed.  If the doctor gave you a prescription medicine for pain, take it as prescribed.  If you are not taking a prescription pain medicine, ask your doctor if you can take an over-the-counter medicine like tylenol or ibuprofen. Sit or lie in positions that are most comfortable and reduce your pain. Try one of these positions when you lie down:  Lie on your back with your knees bent and supported by large pillows.  Lie on the floor with your legs on the seat of a sofa or chair.  Lie on your side with your knees and hips bent and a pillow between your legs.  Lie on your stomach if it does not make pain worse. Do not sit up in bed, and avoid soft couches and twisted positions. Bed rest can help relieve  pain at first, but it delays healing. Avoid bed rest after the first day of back pain.  Change positions every 30 minutes. If you must sit for long periods of time, take breaks from sitting. Get up and walk around, or lie in a comfortable position.  Try using a heating pad on a low or medium setting for 15 to 20 minutes every 2 or 3 hours. Try a warm shower in place of one session with the heating pad.  You can also try an ice pack for 10 to 15 minutes every 2 to 3 hours. Put a thin  cloth between the ice pack and your skin.  Take short walks several times a day. You can start with 5 to 10 minutes, 3 or 4 times a day, and work up to longer walks. Walk on level surfaces and avoid hills and stairs until your back is better.  Return to work and other activities as soon as you can. Continued rest without activity is usually not good for your back.  To prevent future back pain, do exercises to stretch and strengthen your back and stomach. Learn how to use good posture, safe lifting techniques, and proper body mechanics.

## 2019-12-14 NOTE — ED Notes (Signed)
Patient transported to CT 

## 2019-12-14 NOTE — ED Notes (Signed)
Patient returned from CT.   Registration at bedside.

## 2019-12-14 NOTE — ED Provider Notes (Signed)
Plan from off going team. CT lumbar read and dc/  1. No CT evidence of acute vertebral body fracture or traumatic  malalignment.  2. Discogenic changes most pronounced at L5-S1 with at most mild  canal stenosis and foraminal narrowing.  3. Small amount of posterior soft tissue contusive changes to the  left of midline are better assessed on the wider field of view  images in the CT of the abdomen and pelvis from which this study is  reconstructed. Please see dedicated CT for further details.     Will dc patient given no acute findings.     Concha Se, MD 12/14/19 [phone]

## 2019-12-14 NOTE — ED Triage Notes (Signed)
Patient to ED for [facility] injury. States he fell about three feet from a ladder backwards onto a machine at work. Has abrasions to back. Denies hitting his head and denies loss of consciousness.

## 2019-12-14 NOTE — ED Provider Notes (Signed)
[facility] Emergency Department Provider Note  ____________________________________________  Time seen: Approximately 6:09 AM  I have reviewed the triage vital signs and the nursing notes.   HISTORY  Chief Complaint Fall [place])   HPI Harry Peters is a 59 y.o. male no significant past medical history who presents for evaluation of back trauma.  Patient was at work climbing on a ladder  when he missed a step and fell backwards 3 feet onto machinery.  He fell onto his lower back.  He is complaining of severe constant sharp pain in his back that is worse with ambulation.  He denies saddle anesthesia, lower extremity weakness or numbness, urinary or bowel incontinence or retention.  Patient denies head trauma or LOC, no neck pain or chest pain, no hip pain or arm pain, no abdominal pain.  Patient is otherwise healthy and does not take any blood thinners.  Past Medical History:  Diagnosis Date   Motion sickness    Rough seas   Wears dentures    full upper, partial lower    There are no problems to display for this patient.   Past Surgical History:  Procedure Laterality Date   CARDIAC CATHETERIZATION  11/2013   Neg - [facility]   COLONOSCOPY     MOUTH SURGERY     SESAMOIDECTOMY Right 04/07/2015   Procedure: SESAMOIDECTOMY;  Surgeon: Recardo Evangelist, DPM;  Location: [facility];  Service: Podiatry;  Laterality: Right;    Prior to Admission medications   Medication Sig Start Date End Date Taking? Authorizing Provider  ondansetron (ZOFRAN ODT) 4 MG disintegrating tablet Take 1 tablet (4 mg total) by mouth every 8 (eight) hours as needed for nausea or vomiting. 12/18/15   Gayla Doss, MD  oxyCODONE (ROXICODONE) 5 MG immediate release tablet Take 1 tablet (5 mg total) by mouth every 6 (six) hours as needed for moderate pain. Do not drive while taking this medication. 12/18/15   Gayla Doss, MD  oxyCODONE-acetaminophen (PERCOCET) 5-325 MG tablet  Take 1 tablet by mouth every 4 (four) hours as needed. 12/14/19   Nita Sickle, MD  tamsulosin (FLOMAX) 0.4 MG CAPS capsule Take 1 capsule (0.4 mg total) by mouth daily. 12/18/15   Gayla Doss, MD    Allergies Penicillins  History reviewed. No pertinent family history.  Social History Social History   Tobacco Use   Smoking status: Former Smoker    Quit date: 11/05/2005    Years since quitting: 14.1   Smokeless tobacco: Never Used  Substance Use Topics   Alcohol use: Yes    Alcohol/week: 2.0 standard drinks    Types: 2 Shots of liquor per week   Drug use: Not on file    Review of Systems  Constitutional: Negative for fever. Eyes: Negative for visual changes. ENT: Negative for facial injury or neck injury Cardiovascular: Negative for chest injury. Respiratory: Negative for shortness of breath. Negative for chest wall injury. Gastrointestinal: Negative for abdominal pain or injury. Genitourinary: Negative for dysuria. Musculoskeletal: + back injury, negative for arm or leg pain. Skin: Negative for laceration/abrasions. Neurological: Negative for head injury.   ____________________________________________   PHYSICAL EXAM:  VITAL SIGNS: ED Triage Vitals  Enc Vitals Group     BP 12/14/19 0554 127/70     Pulse Rate 12/14/19 0554 73     Resp 12/14/19 0554 18     Temp 12/14/19 0554 98.7 F (37.1 C)     Temp Source 12/14/19 0554 Oral  SpO2 12/14/19 0554 96 %     Weight 12/14/19 0556 265 lb (120.2 kg)     Height 12/14/19 0556 6\' 2"  (1.88 m)     Head Circumference --      Peak Flow --      Pain Score 12/14/19 0555 8     Pain Loc --      Pain Edu? --      Excl. in GC? --     Full spinal precautions maintained throughout the trauma exam. Constitutional: Alert and oriented. No acute distress. Does not appear intoxicated. HEENT Head: Normocephalic and atraumatic. Face: No facial bony tenderness. Stable midface Ears: No hemotympanum bilaterally. No Battle  sign Eyes: No eye injury. PERRL. No raccoon eyes Nose: Nontender. No epistaxis. No rhinorrhea Mouth/Throat: Mucous membranes are moist. No oropharyngeal blood. No dental injury. Airway patent without stridor. Normal voice. Neck: no C-collar. No midline c-spine tenderness.  Cardiovascular: Normal rate, regular rhythm. Normal and symmetric distal pulses are present in all extremities. Pulmonary/Chest: Chest wall is stable and nontender to palpation/compression. Normal respiratory effort. Breath sounds are normal. No crepitus.  Abdominal: Soft, nontender, non distended. Musculoskeletal: Large bruise in the midline lower back with diffuse tenderness midline and paraspinally, no deformities. Nontender with normal full range of motion in all extremities. No deformities. No thoracic midline spinal tenderness. Pelvis is stable. Skin: Skin is warm, dry and intact. No abrasions or contutions. Psychiatric: Speech and behavior are appropriate. Neurological: Normal speech and language. Moves all extremities to command. Intact strength and sensation of b/l LE. No gross focal neurologic deficits are appreciated.  Glascow Coma Score: 4 - Opens eyes on own 6 - Follows simple motor commands 5 - Alert and oriented GCS: 15   ____________________________________________   LABS (all labs ordered are listed, but only abnormal results are displayed)  Labs Reviewed - No data to display ____________________________________________  EKG  none  ____________________________________________  RADIOLOGY  I have personally reviewed the images performed during this visit and I agree with the Radiologist's read.   Interpretation by Radiologist:  CT ABDOMEN PELVIS W CONTRAST  Result Date: 12/14/2019 CLINICAL DATA:  Low back pain, fell 3 feet from ladder landing on a machine at work with abrasions to the back. EXAM: CT ABDOMEN AND PELVIS WITH CONTRAST TECHNIQUE: Multidetector CT imaging of the abdomen and pelvis  was performed using the standard protocol following bolus administration of intravenous contrast. CONTRAST:  02/11/2020 OMNIPAQUE IOHEXOL 300 MG/ML  SOLN COMPARISON:  None. FINDINGS: Lower chest: Lung bases are clear. Normal heart size. No pericardial effusion. Hepatobiliary: No direct hepatic injury or perihepatic hematoma. Diffuse hepatic hypoattenuation compatible with hepatic steatosis. No focal liver abnormality is seen. No gallstones, gallbladder wall thickening, or biliary dilatation. Pancreas: Partial fatty replacement of the pancreas. No direct pancreatic injury. No pancreatic ductal dilatation or surrounding inflammatory changes. Spleen: Normal in size without focal abnormality. No direct splenic injury or perisplenic hematoma. Adrenals/Urinary Tract: Normal adrenal glands. No direct renal injury or perirenal hemorrhage. Kidneys enhance and excrete symmetrically. No worrisome renal lesions. No urolithiasis or hydronephrosis. Urinary bladder is unremarkable. Stomach/Bowel: Distal esophagus, stomach and duodenal sweep are unremarkable. No small bowel wall thickening or dilatation. No evidence of obstruction. A normal appendix is visualized. No colonic dilatation or wall thickening. Few scattered distal colonic diverticula without evidence of focal inflammation to suggest acute diverticulitis. Vascular/Lymphatic: Atherosclerotic plaque within the normal caliber aorta. No suspicious or enlarged lymph nodes in the included lymphatic chains. Reproductive: The prostate and seminal  vesicles are unremarkable. Other: Soft tissue stranding and contusive changes noted in the posterior abdominal soft tissues to the left of midline (2/62). No large hematoma or evidence of active contrast extravasation. No traumatic abdominal wall dehiscence or other injury. Small bilateral fat containing inguinal hernias, similar to prior. No bowel containing hernia. Musculoskeletal: No acute fracture or traumatic malalignment of the lumbar  spine. Discogenic changes in the spine maximal at L5-S1 with vacuum disc are similar to comparison CT from 12/18/2015. There is no abnormal intramuscular hemorrhage or other acute muscular abnormality identified. IMPRESSION: 1. Soft tissue stranding and contusive changes in the posterior abdominal soft tissues to the left of midline. No large hematoma or evidence of active contrast extravasation. 2. Hepatic steatosis. 3. Few scattered distal colonic diverticula without evidence of acute diverticulitis. 4. Aortic Atherosclerosis (ICD10-I70.0). 5. Dedicated lumbar spine CT reconstructions were generated and dictated separately, please see report for further details. Electronically Signed   By: Kreg Shropshire M.D.   On: 12/14/2019 06:50   CT L-SPINE NO CHARGE  Result Date: 12/14/2019 CLINICAL DATA:  Larey Seat 3 feet from ladder posteriorly landing on machinery at work EXAM: CT LUMBAR SPINE WITHOUT CONTRAST TECHNIQUE: Multidetector CT reconstructions of the lumbar spine was performed utilizing the contemporary CT of the abdomen and pelvis. COMPARISON:  CT renal colic 12/18/2015 FINDINGS: Segmentation: 5 lumbar type vertebrae. Alignment: Preservation of the normal lumbar lordosis. No traumatic listhesis or spondylolysis. Vertebrae: No acute vertebral body fracture or height loss is seen. Posterior elements are intact. Discogenic changes most pronounced at L5-S1 are similar to prior with endplate spurring. Enthesopathic changes upon the spinous processes are unchanged from comparison exam as well. Paraspinal and other soft tissues: No paravertebral fluid or hemorrhage. No canal hematoma is evident. Small amount of posterior soft tissue contusive changes to the left of midline are better assessed on the wider field of view images in the CT of the abdomen and pelvis from which this study is reconstructed. Please see dedicated CT for further details. Disc levels: Level by level evaluation of the lumbar spine below: T12-L1: No  significant disc abnormality. No significant spinal canal stenosis or foraminal narrowing. L1-L5: Minimal disc height loss and global disc bulges. No significant spinal canal stenosis or foraminal narrowing. L5-S1: Moderate disc height loss with vacuum phenomenon and global disc bulging. Endplate spurring is predominantly seen anteriorly. Mild facet arthropathy noted at this level as well. Findings result in at most mild canal and bilateral neural foraminal narrowing. IMPRESSION: 1. No CT evidence of acute vertebral body fracture or traumatic malalignment. 2. Discogenic changes most pronounced at L5-S1 with at most mild canal stenosis and foraminal narrowing. 3. Small amount of posterior soft tissue contusive changes to the left of midline are better assessed on the wider field of view images in the CT of the abdomen and pelvis from which this study is reconstructed. Please see dedicated CT for further details. Electronically Signed   By: Kreg Shropshire M.D.   On: 12/14/2019 07:00      ____________________________________________   PROCEDURES  Procedure(s) performed: None Procedures Critical Care performed:  None ____________________________________________   INITIAL IMPRESSION / ASSESSMENT AND PLAN / ED COURSE  59 y.o. male no significant past medical history who presents for evaluation of back trauma after falling from a 3 foot ladder onto machinery.  Patient has a large bruise on his lower back and has diffuse tenderness both paraspinally and midline with no obvious deformity.  Otherwise neurologically intact.  No signs of  trauma on the upper back, neck, head, chest, or extremities.  Will get CT abdomen pelvis and lumbar spine to evaluate retroperitoneum as well. No clinical signs of cauda equina.    ED COURSE: CTs with no  Retroperitoneal or spine injuries. Patient was dc home on percocet and follow up with PCP. Discussed standard return precautions and follow up with PCP    Please note:   Patient was evaluated in Emergency Department today for the symptoms described in the history of present illness. Patient was evaluated in the context of the global COVID-19 pandemic, which necessitated consideration that the patient might be at risk for infection with the SARS-CoV-2 virus that causes COVID-19. Institutional protocols and algorithms that pertain to the evaluation of patients at risk for COVID-19 are in a state of rapid change based on information released by regulatory bodies including the CDC and federal and state organizations. These policies and algorithms were followed during the patient's care in the ED.  Some ED evaluations and interventions may be delayed as a result of limited staffing during the pandemic.   As part of my medical decision making, I reviewed the following data within the [place] notes reviewed and incorporated, Old chart reviewed, Radiograph reviewed , Notes from prior ED visits and [place] Controlled Substance Database   ____________________________________________   FINAL CLINICAL IMPRESSION(S) / ED DIAGNOSES   Final diagnoses:  Acute low back pain due to trauma  Fall, initial encounter  Abrasion of back, unspecified laterality, initial encounter      NEW MEDICATIONS STARTED DURING THIS VISIT:  ED Discharge Orders         Ordered    oxyCODONE-acetaminophen (PERCOCET) 5-325 MG tablet  Every 4 hours PRN     12/14/19 0657           Note:  This document was prepared using Dragon voice recognition software and may include unintentional dictation errors.    Alfred Levins, [place], MD 12/14/19 2325

## 2020-06-14 IMAGING — CT CT ABD-PELV W/ CM
2 of 5 series · 15 of 46 positions shown, 17 images · IV contrast (APPLIED)
Comparison: None.

CLINICAL DATA: Low back pain, fell 3 feet from ladder landing on a
machine at work with abrasions to the back.

EXAM:
CT ABDOMEN AND PELVIS WITH CONTRAST
TECHNIQUE: Multidetector CT imaging of the abdomen and pelvis was performed
using the standard protocol following bolus administration of
intravenous contrast.
CONTRAST:  100mL OMNIPAQUE IOHEXOL 300 MG/ML  SOLN

[Series 2: routine abd/pel with · axial · 0.90mm/px · z∈[-1197,-687]mm · 12 of 114 slices shown, 14 images]
[im 6/114  soft-tissue]
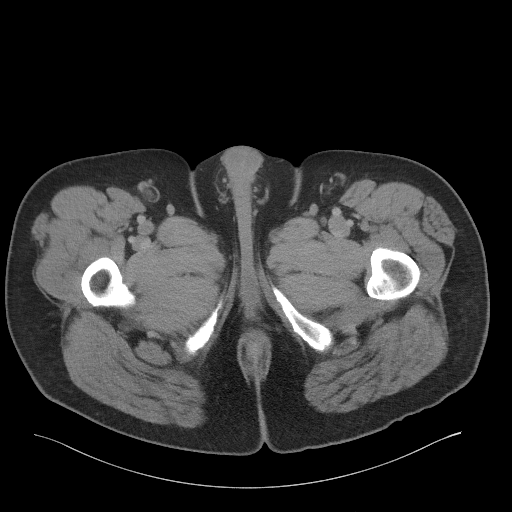
[im 6/114  bone]
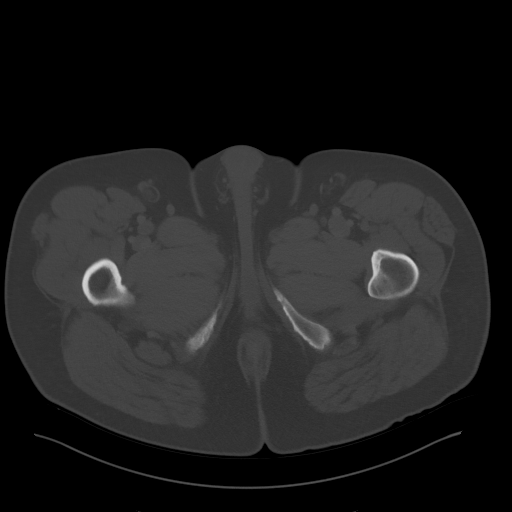
[im 18/114  soft-tissue]
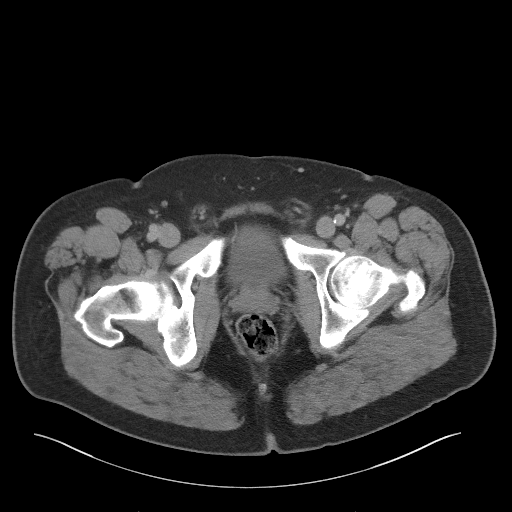
[im 24/114  soft-tissue]
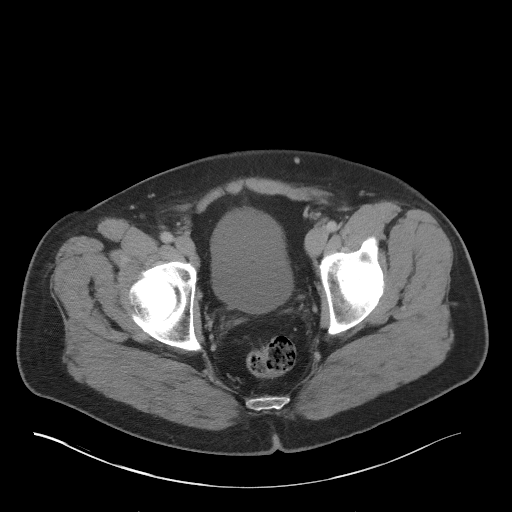
[im 36/114  soft-tissue]
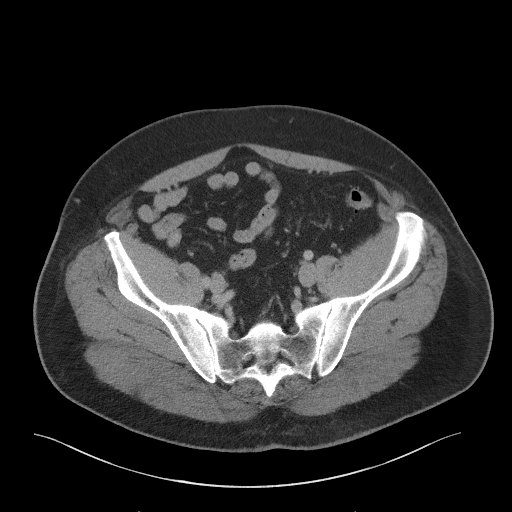
[im 42/114  soft-tissue]
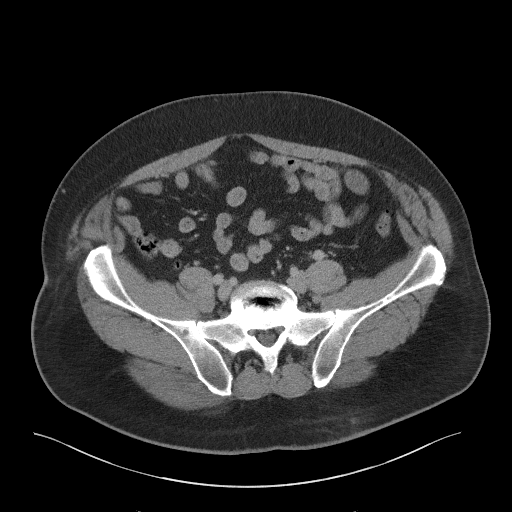
[im 54/114  soft-tissue]
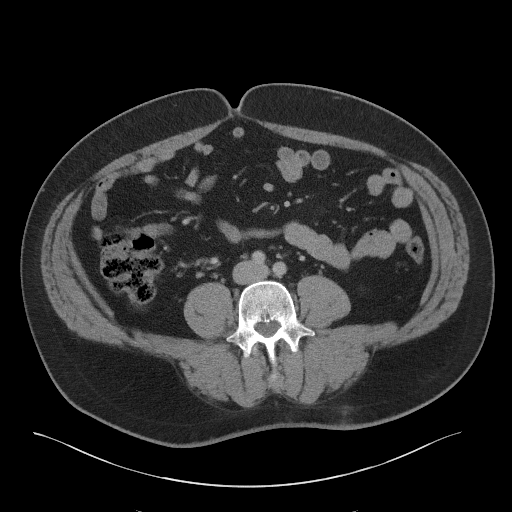
[im 60/114  soft-tissue]
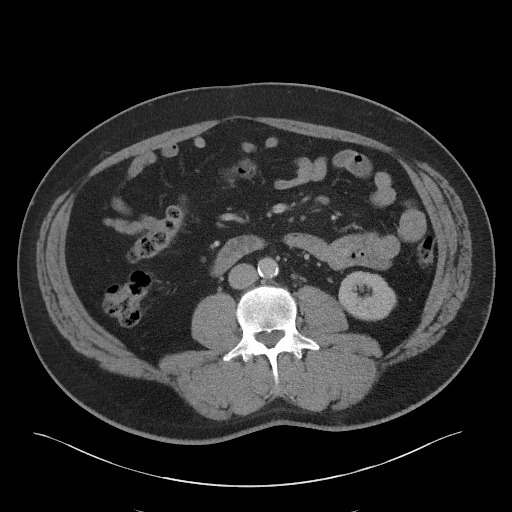
[im 72/114  soft-tissue]
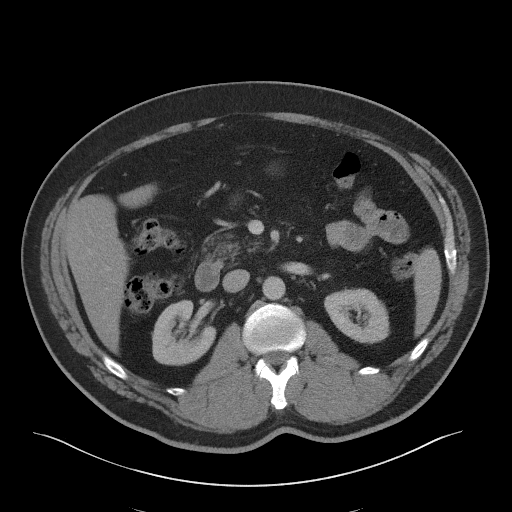
[im 78/114  soft-tissue]
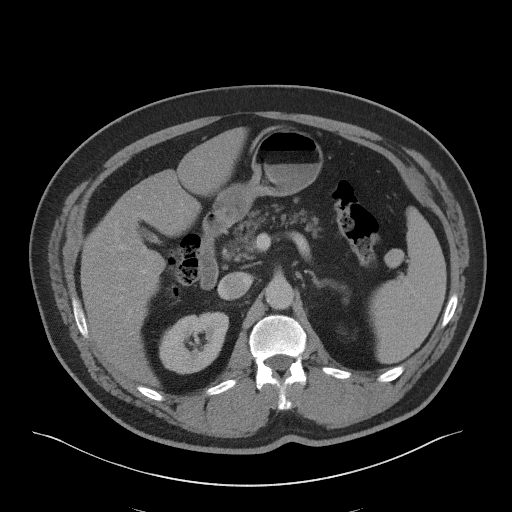
[im 78/114  bone]
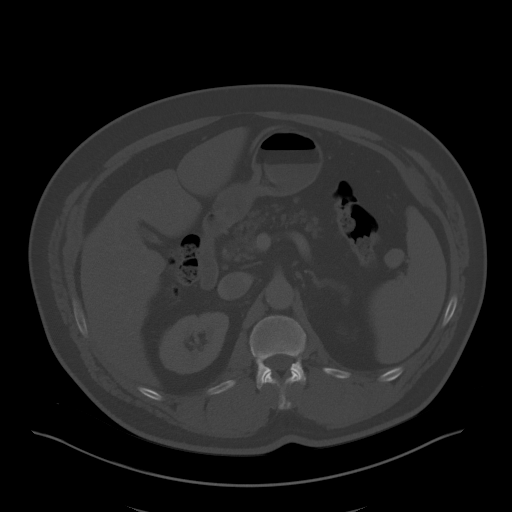
[im 90/114  soft-tissue]
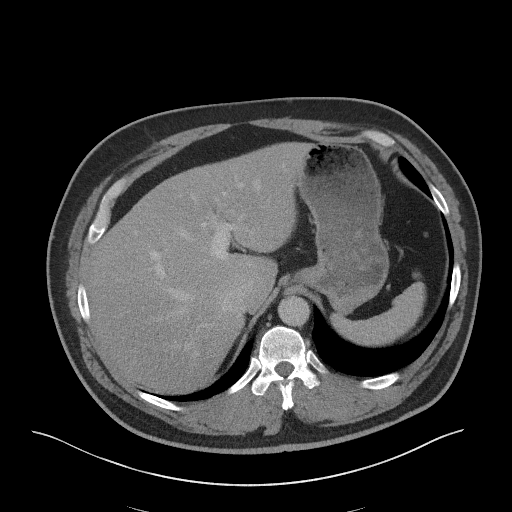
[im 96/114  soft-tissue]
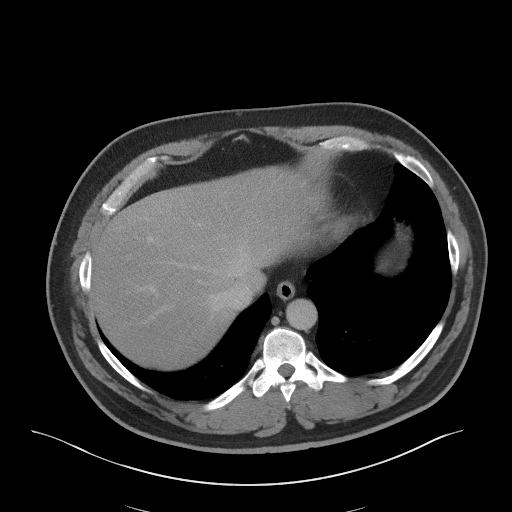
[im 108/114  soft-tissue]
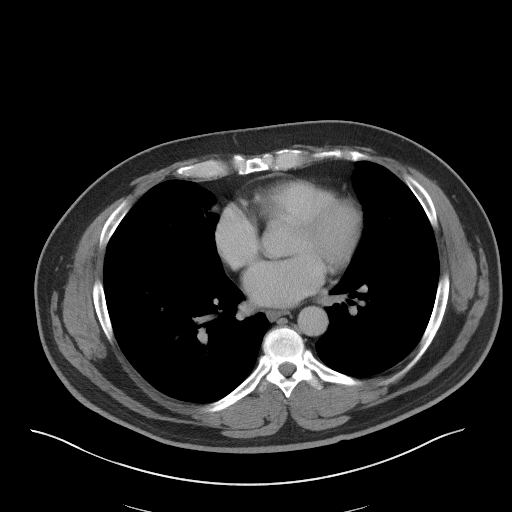

[Series 5: coronal st · coronal · 0.85mm/px · 3 of 106 slices shown]
[im 36/106  soft-tissue]
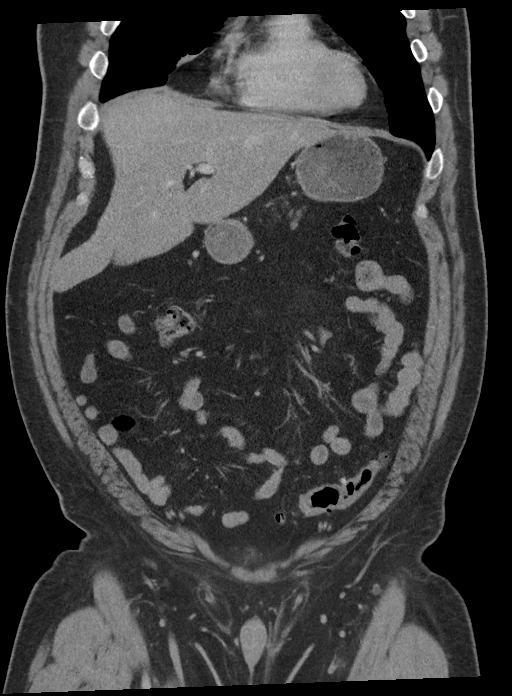
[im 47/106  soft-tissue]
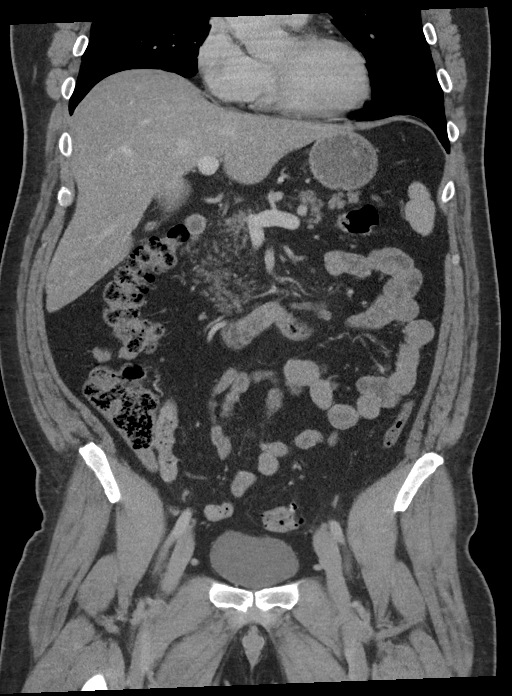
[im 59/106  soft-tissue]
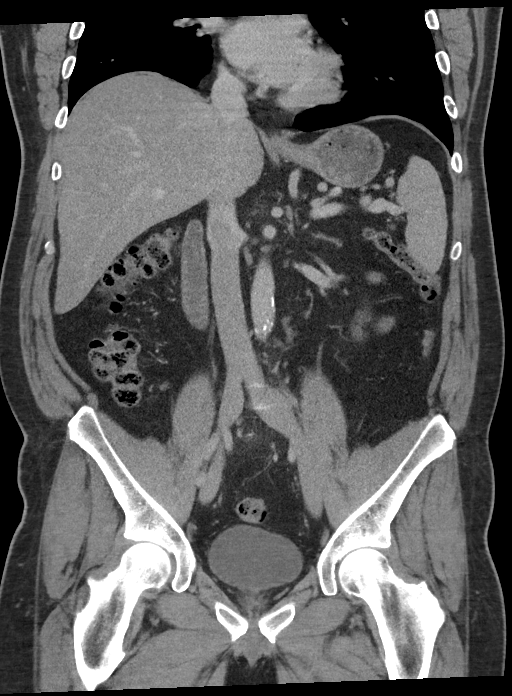

[15 of 46 positions shown; findings below may reference images not displayed]

FINDINGS: Lower chest: Lung bases are clear. Normal heart size. No pericardial
effusion.

Hepatobiliary: No direct hepatic injury or perihepatic hematoma.
Diffuse hepatic hypoattenuation compatible with hepatic steatosis.
No focal liver abnormality is seen. No gallstones, gallbladder wall
thickening, or biliary dilatation.

Pancreas: Partial fatty replacement of the pancreas. No direct
pancreatic injury. No pancreatic ductal dilatation or surrounding
inflammatory changes.

Spleen: Normal in size without focal abnormality. No direct splenic
injury or perisplenic hematoma.

Adrenals/Urinary Tract: Normal adrenal glands. No direct renal
injury or perirenal hemorrhage. Kidneys enhance and excrete
symmetrically. No worrisome renal lesions. No urolithiasis or
hydronephrosis. Urinary bladder is unremarkable.

Stomach/Bowel: Distal esophagus, stomach and duodenal sweep are
unremarkable. No small bowel wall thickening or dilatation. No
evidence of obstruction. A normal appendix is visualized. No colonic
dilatation or wall thickening. Few scattered distal colonic
diverticula without evidence of focal inflammation to suggest acute
diverticulitis.

Vascular/Lymphatic: Atherosclerotic plaque within the normal caliber
aorta. No suspicious or enlarged lymph nodes in the included
lymphatic chains.

Reproductive: The prostate and seminal vesicles are unremarkable.

Other: Soft tissue stranding and contusive changes noted in the
posterior abdominal soft tissues to the left of midline (2/62). No
large hematoma or evidence of active contrast extravasation. No
traumatic abdominal wall dehiscence or other injury. Small bilateral
fat containing inguinal hernias, similar to prior. No bowel
containing hernia.

Musculoskeletal: No acute fracture or traumatic malalignment of the
lumbar spine. Discogenic changes in the spine maximal at L5-S1 with
vacuum disc are similar to comparison CT from 12/18/2015. There is
no abnormal intramuscular hemorrhage or other acute muscular
abnormality identified.
IMPRESSION: 1. Soft tissue stranding and contusive changes in the posterior
abdominal soft tissues to the left of midline. No large hematoma or
evidence of active contrast extravasation.
2. Hepatic steatosis.
3. Few scattered distal colonic diverticula without evidence of
acute diverticulitis.
4. Aortic Atherosclerosis ([id number]-FE3.3).
5. Dedicated lumbar spine CT reconstructions were generated and
dictated separately, please see report for further details.

## 2022-08-09 ENCOUNTER — Encounter: Payer: Self-pay | Admitting: Emergency Medicine

## 2022-08-09 ENCOUNTER — Ambulatory Visit
Admission: EM | Admit: 2022-08-09 | Discharge: 2022-08-09 | Disposition: A | Discharge status: Home / Self Care | Payer: PRIVATE HEALTH INSURANCE | Attending: Physician Assistant | Admitting: Physician Assistant

## 2022-08-09 DIAGNOSIS — W57XXXA Bitten or stung by nonvenomous insect and other nonvenomous arthropods, initial encounter: Secondary | ICD-10-CM

## 2022-08-09 DIAGNOSIS — S90561A Insect bite (nonvenomous), right ankle, initial encounter: Secondary | ICD-10-CM

## 2022-08-09 DIAGNOSIS — R238 Other skin changes: Secondary | ICD-10-CM

## 2022-08-09 MED ORDER — TRIAMCINOLONE ACETONIDE 0.1 % EX CREA: 1.0000 | TOPICAL_CREAM | Freq: Two times a day (BID) | CUTANEOUS | 0 refills | Status: DC

## 2022-08-09 NOTE — Discharge Instructions (Signed)
-  Apply ice, clean with soap and water.  Apply the topical steroid cream - Return if increased redness, swelling, pain, pustular drainage or fever

## 2022-08-09 NOTE — ED Provider Notes (Signed)
[facility]    CSN: [id number] Arrival date & time: 08/09/22  1517      History   Chief Complaint Chief Complaint  Patient presents with   Blister    HPI Harry Peters is a 61 y.o. male presenting for a blister of his right ankle.  Patient reports he was flying back from the [place] 2 days ago and felt a sting to his ankle.  He reports that he noticed some redness in the area and then today he noticed a large blister.  He says it is a little sore to touch.  He has been applying anti-itch cream and hydrocortisone cream.  Denies fever.  Has not had any drainage.  No other concerns.  HPI  Past Medical History:  Diagnosis Date   Motion sickness    Rough seas   Wears dentures    full upper, partial lower    There are no problems to display for this patient.   Past Surgical History:  Procedure Laterality Date   CARDIAC CATHETERIZATION  11/2013   Neg - [facility]   COLONOSCOPY     MOUTH SURGERY     SESAMOIDECTOMY Right 04/07/2015   Procedure: SESAMOIDECTOMY;  Surgeon: Recardo Evangelist, DPM;  Location: [facility];  Service: Podiatry;  Laterality: Right;       Home Medications    Prior to Admission medications   Medication Sig Start Date End Date Taking? Authorizing Provider  triamcinolone cream (KENALOG) 0.1 % Apply 1 Application topically 2 (two) times daily. 08/09/22  Yes Eusebio Friendly B, PA-C  ondansetron (ZOFRAN ODT) 4 MG disintegrating tablet Take 1 tablet (4 mg total) by mouth every 8 (eight) hours as needed for nausea or vomiting. 12/18/15   Gayla Doss, MD  oxyCODONE (ROXICODONE) 5 MG immediate release tablet Take 1 tablet (5 mg total) by mouth every 6 (six) hours as needed for moderate pain. Do not drive while taking this medication. 12/18/15   Gayla Doss, MD  oxyCODONE-acetaminophen (PERCOCET) 5-325 MG tablet Take 1 tablet by mouth every 4 (four) hours as needed. 12/14/19   Nita Sickle, MD  tamsulosin (FLOMAX) 0.4 MG CAPS capsule Take  1 capsule (0.4 mg total) by mouth daily. 12/18/15   Gayla Doss, MD    Family History No family history on file.  Social History Social History   Tobacco Use   Smoking status: Former    Types: Cigarettes    Quit date: 11/05/2005    Years since quitting: 16.7   Smokeless tobacco: Never  Vaping Use   Vaping Use: Never used  Substance Use Topics   Alcohol use: Yes    Alcohol/week: 2.0 standard drinks of alcohol    Types: 2 Shots of liquor per week     Allergies   Penicillins   Review of Systems Review of Systems  Constitutional:  Negative for fatigue and fever.  Musculoskeletal:  Negative for arthralgias and joint swelling.  Skin:  Positive for color change and rash. Negative for wound.     Physical Exam Triage Vital Signs ED Triage Vitals  Enc Vitals Group     BP      Pulse      Resp      Temp      Temp src      SpO2      Weight      Height      Head Circumference      Peak Flow  Pain Score      Pain Loc      Pain Edu?      Excl. in GC?    No data found.  Updated Vital Signs BP 123/77 (BP Location: Left Arm)   Pulse 68   Temp 98.2 F (36.8 C) (Oral)   Resp 16   SpO2 97%      Physical Exam Vitals and nursing note reviewed.  Constitutional:      General: He is not in acute distress.    Appearance: Normal appearance. He is well-developed. He is not ill-appearing.  HENT:     Head: Normocephalic and atraumatic.  Eyes:     General: No scleral icterus.    Conjunctiva/sclera: Conjunctivae normal.  Cardiovascular:     Rate and Rhythm: Normal rate.  Pulmonary:     Effort: Pulmonary effort is normal. No respiratory distress.  Musculoskeletal:     Cervical back: Neck supple.  Skin:    General: Skin is warm and dry.     Capillary Refill: Capillary refill takes less than 2 seconds.     Findings: Rash (large vesicle of right ankle on erythematous base) present.  Neurological:     General: No focal deficit present.     Mental Status: He is  alert. Mental status is at baseline.     Motor: No weakness.  Psychiatric:        Mood and Affect: Mood normal.        Behavior: Behavior normal.      UC Treatments / Results  Labs (all labs ordered are listed, but only abnormal results are displayed) Labs Reviewed - No data to display  EKG   Radiology No results found.  Procedures Procedures (including critical care time)  Medications Ordered in UC Medications - No data to display  Initial Impression / Assessment and Plan / UC Course  I have reviewed the triage vital signs and the nursing notes.  Pertinent labs & imaging results that were available during my care of the patient were reviewed by me and considered in my medical decision making (see chart for details).   61 y/o male presents for blister of the right ankle.  He noticed blister today but he had a small area of rash started 2 days ago after he was stung by something while on an airplane.  On examination patient has clear large vesicle of the right ankle on erythematous base.  When I barely touch the area of the blister pops and drains clear fluid.  I expressed the fluid from the vesicle and covered with a Band-Aid.  Discussed with him the importance of cleaning with soap and water.  Sent triamcinolone cream to pharmacy because his symptoms are consistent with that of an insect bite.  No sign or suspicion of infectious cause.  Advised to return though if he does develop a fever, increased swelling, pustular drainage, etc.   Final Clinical Impressions(s) / UC Diagnoses   Final diagnoses:  Vesicle of skin  Insect bite of right ankle, initial encounter     Discharge Instructions      -Apply ice, clean with soap and water.  Apply the topical steroid cream - Return if increased redness, swelling, pain, pustular drainage or fever     ED Prescriptions     Medication Sig Dispense Auth. Provider   triamcinolone cream (KENALOG) 0.1 % Apply 1 Application  topically 2 (two) times daily. 30 g Gareth Morgan      PDMP  not reviewed this encounter.   Danton Clap, PA-C 08/09/22 1616

## 2022-08-09 NOTE — ED Triage Notes (Signed)
Pt presents with a blister on his right ankle. Pt states it started out as a small bump and blistered today.

## 2023-06-26 ENCOUNTER — Other Ambulatory Visit
Admission: RE | Admit: 2023-06-26 | Discharge: 2023-06-26 | Disposition: A | Discharge status: Home / Self Care | Payer: No Typology Code available for payment source | Source: Ambulatory Visit | Attending: Sports Medicine | Admitting: Sports Medicine

## 2023-06-26 DIAGNOSIS — M25461 Effusion, right knee: Secondary | ICD-10-CM | POA: Insufficient documentation

## 2023-06-26 LAB — SYNOVIAL CELL COUNT + DIFF, W/ CRYSTALS
Crystals, Fluid: NONE SEEN
Eosinophils-Synovial: 0 %
Lymphocytes-Synovial Fld: 0 %
Monocyte-Macrophage-Synovial Fluid: 3 %
Neutrophil, Synovial: 97 %
WBC, Synovial: 10520 /mm3 — ABNORMAL HIGH (ref 0–200)

## 2023-09-10 ENCOUNTER — Observation Stay: Payer: No Typology Code available for payment source

## 2023-09-10 ENCOUNTER — Encounter: Payer: Self-pay | Admitting: Emergency Medicine

## 2023-09-10 ENCOUNTER — Inpatient Hospital Stay
Admission: EM | Admit: 2023-09-10 | Discharge: 2023-09-15 | DRG: 488 | Disposition: A | Discharge status: Home / Self Care | Payer: No Typology Code available for payment source | Attending: Student in an Organized Health Care Education/Training Program | Admitting: Student in an Organized Health Care Education/Training Program

## 2023-09-10 ENCOUNTER — Other Ambulatory Visit: Payer: Self-pay

## 2023-09-10 ENCOUNTER — Ambulatory Visit: Admission: EM | Admit: 2023-09-10 | Discharge: 2023-09-10 | Payer: PRIVATE HEALTH INSURANCE

## 2023-09-10 ENCOUNTER — Emergency Department: Payer: PRIVATE HEALTH INSURANCE

## 2023-09-10 DIAGNOSIS — G4730 Sleep apnea, unspecified: Secondary | ICD-10-CM | POA: Diagnosis present

## 2023-09-10 DIAGNOSIS — Z23 Encounter for immunization: Secondary | ICD-10-CM

## 2023-09-10 DIAGNOSIS — E669 Obesity, unspecified: Secondary | ICD-10-CM | POA: Diagnosis present

## 2023-09-10 DIAGNOSIS — Z6833 Body mass index (BMI) 33.0-33.9, adult: Secondary | ICD-10-CM

## 2023-09-10 DIAGNOSIS — M7110 Other infective bursitis, unspecified site: Secondary | ICD-10-CM | POA: Diagnosis not present

## 2023-09-10 DIAGNOSIS — M71121 Other infective bursitis, right elbow: Secondary | ICD-10-CM | POA: Diagnosis not present

## 2023-09-10 DIAGNOSIS — G8929 Other chronic pain: Secondary | ICD-10-CM | POA: Diagnosis present

## 2023-09-10 DIAGNOSIS — M25462 Effusion, left knee: Secondary | ICD-10-CM | POA: Diagnosis present

## 2023-09-10 DIAGNOSIS — R6889 Other general symptoms and signs: Secondary | ICD-10-CM | POA: Diagnosis not present

## 2023-09-10 DIAGNOSIS — M71129 Other infective bursitis, unspecified elbow: Secondary | ICD-10-CM | POA: Diagnosis present

## 2023-09-10 DIAGNOSIS — M109 Gout, unspecified: Secondary | ICD-10-CM | POA: Insufficient documentation

## 2023-09-10 DIAGNOSIS — M009 Pyogenic arthritis, unspecified: Secondary | ICD-10-CM

## 2023-09-10 DIAGNOSIS — Z880 Allergy status to penicillin: Secondary | ICD-10-CM

## 2023-09-10 DIAGNOSIS — Z87891 Personal history of nicotine dependence: Secondary | ICD-10-CM

## 2023-09-10 DIAGNOSIS — R739 Hyperglycemia, unspecified: Secondary | ICD-10-CM | POA: Diagnosis present

## 2023-09-10 DIAGNOSIS — Z79899 Other long term (current) drug therapy: Secondary | ICD-10-CM

## 2023-09-10 DIAGNOSIS — R7303 Prediabetes: Secondary | ICD-10-CM | POA: Diagnosis present

## 2023-09-10 DIAGNOSIS — Z9889 Other specified postprocedural states: Secondary | ICD-10-CM | POA: Insufficient documentation

## 2023-09-10 DIAGNOSIS — M94261 Chondromalacia, right knee: Secondary | ICD-10-CM | POA: Diagnosis present

## 2023-09-10 DIAGNOSIS — G4733 Obstructive sleep apnea (adult) (pediatric): Secondary | ICD-10-CM | POA: Diagnosis present

## 2023-09-10 DIAGNOSIS — E782 Mixed hyperlipidemia: Secondary | ICD-10-CM | POA: Insufficient documentation

## 2023-09-10 DIAGNOSIS — M19021 Primary osteoarthritis, right elbow: Secondary | ICD-10-CM | POA: Diagnosis present

## 2023-09-10 DIAGNOSIS — L03113 Cellulitis of right upper limb: Secondary | ICD-10-CM | POA: Diagnosis present

## 2023-09-10 LAB — CBC WITH DIFFERENTIAL/PLATELET
Abs Immature Granulocytes: 0.1 10*3/uL — ABNORMAL HIGH (ref 0.00–0.07)
Basophils Absolute: 0.1 10*3/uL (ref 0.0–0.1)
Basophils Relative: 1 %
Eosinophils Absolute: 0.1 10*3/uL (ref 0.0–0.5)
Eosinophils Relative: 1 %
HCT: 45.2 % (ref 39.0–52.0)
Hemoglobin: 15.6 g/dL (ref 13.0–17.0)
Immature Granulocytes: 1 %
Lymphocytes Relative: 17 %
Lymphs Abs: 2 10*3/uL (ref 0.7–4.0)
MCH: 29.9 pg (ref 26.0–34.0)
MCHC: 34.5 g/dL (ref 30.0–36.0)
MCV: 86.8 fL (ref 80.0–100.0)
Monocytes Absolute: 0.9 10*3/uL (ref 0.1–1.0)
Monocytes Relative: 8 %
Neutro Abs: 8.4 10*3/uL — ABNORMAL HIGH (ref 1.7–7.7)
Neutrophils Relative %: 72 %
Platelets: 229 10*3/uL (ref 150–400)
RBC: 5.21 MIL/uL (ref 4.22–5.81)
RDW: 13.5 % (ref 11.5–15.5)
WBC: 11.6 10*3/uL — ABNORMAL HIGH (ref 4.0–10.5)
nRBC: 0 % (ref 0.0–0.2)

## 2023-09-10 LAB — COMPREHENSIVE METABOLIC PANEL
ALT: 15 U/L (ref 0–44)
AST: 14 U/L — ABNORMAL LOW (ref 15–41)
Albumin: 4.1 g/dL (ref 3.5–5.0)
Alkaline Phosphatase: 84 U/L (ref 38–126)
Anion gap: 8 (ref 5–15)
BUN: 11 mg/dL (ref 6–20)
CO2: 27 mmol/L (ref 22–32)
Calcium: 8.8 mg/dL — ABNORMAL LOW (ref 8.9–10.3)
Chloride: 102 mmol/L (ref 98–111)
Creatinine, Ser: 1.06 mg/dL (ref 0.61–1.24)
GFR, Estimated: >60 mL/min (ref >60)
Glucose, Bld: 110 mg/dL — ABNORMAL HIGH (ref 70–99)
Potassium: 3.7 mmol/L (ref 3.5–5.1)
Sodium: 137 mmol/L (ref 135–145)
Total Bilirubin: 0.9 mg/dL (ref <1.2)
Total Protein: 7.2 g/dL (ref 6.5–8.1)

## 2023-09-10 LAB — LACTIC ACID, PLASMA: Lactic Acid, Venous: 1.1 mmol/L (ref 0.5–1.9)

## 2023-09-10 LAB — SEDIMENTATION RATE: Sed Rate: 28 mm/h — ABNORMAL HIGH (ref 0–20)

## 2023-09-10 LAB — C-REACTIVE PROTEIN: CRP: 9.5 mg/dL — ABNORMAL HIGH (ref <1.0)

## 2023-09-10 LAB — URIC ACID: Uric Acid, Serum: 9.5 mg/dL — ABNORMAL HIGH (ref 3.7–8.6)

## 2023-09-10 MED ORDER — VANCOMYCIN HCL IN DEXTROSE 1-5 GM/200ML-% IV SOLN
1000.0000 mg | Freq: Once | INTRAVENOUS | Status: AC
  Administered 2023-09-10: 1000 mg via INTRAVENOUS
  Filled 2023-09-10: qty 200

## 2023-09-10 MED ORDER — ACETAMINOPHEN 650 MG RE SUPP
650.0000 mg | Freq: Four times a day (QID) | RECTAL | Status: DC | PRN
Start: 2023-09-10 — End: 2023-09-12

## 2023-09-10 MED ORDER — OXYCODONE HCL 5 MG PO TABS
5.0000 mg | ORAL_TABLET | Freq: Four times a day (QID) | ORAL | Status: DC | PRN
  Administered 2023-09-11 – 2023-09-12 (×3): 5 mg via ORAL
  Filled 2023-09-10 (×3): qty 1

## 2023-09-10 MED ORDER — ACETAMINOPHEN 325 MG PO TABS
650.0000 mg | ORAL_TABLET | Freq: Four times a day (QID) | ORAL | Status: DC | PRN
  Administered 2023-09-11: 650 mg via ORAL
  Filled 2023-09-10 (×2): qty 2

## 2023-09-10 MED ORDER — POLYETHYLENE GLYCOL 3350 17 G PO PACK
17.0000 g | PACK | Freq: Every day | ORAL | Status: DC | PRN
Start: 2023-09-10 — End: 2023-09-12

## 2023-09-10 MED ORDER — SODIUM CHLORIDE 0.9% FLUSH
3.0000 mL | Freq: Two times a day (BID) | INTRAVENOUS | Status: DC
  Administered 2023-09-11 – 2023-09-13 (×5): 3 mL via INTRAVENOUS

## 2023-09-10 MED ORDER — IOHEXOL 300 MG/ML  SOLN
100.0000 mL | Freq: Once | INTRAMUSCULAR | Status: AC | PRN
  Administered 2023-09-10: 100 mL via INTRAVENOUS

## 2023-09-10 MED ORDER — VANCOMYCIN HCL 1750 MG/350ML IV SOLN
1750.0000 mg | Freq: Two times a day (BID) | INTRAVENOUS | Status: DC
  Administered 2023-09-11: 1750 mg via INTRAVENOUS
  Filled 2023-09-10: qty 350

## 2023-09-10 MED ORDER — CIPROFLOXACIN IN D5W 400 MG/200ML IV SOLN
400.0000 mg | Freq: Two times a day (BID) | INTRAVENOUS | Status: DC
  Administered 2023-09-11: 400 mg via INTRAVENOUS
  Filled 2023-09-10 (×2): qty 200

## 2023-09-10 MED ORDER — OXYCODONE-ACETAMINOPHEN 5-325 MG PO TABS
1.0000 | ORAL_TABLET | ORAL | Status: DC | PRN
  Administered 2023-09-10: 1 via ORAL
  Filled 2023-09-10: qty 1

## 2023-09-10 MED ORDER — ONDANSETRON HCL 4 MG PO TABS
4.0000 mg | ORAL_TABLET | Freq: Four times a day (QID) | ORAL | Status: DC | PRN
Start: 2023-09-10 — End: 2023-09-12
  Administered 2023-09-11: 4 mg via ORAL
  Filled 2023-09-10: qty 1

## 2023-09-10 MED ORDER — CIPROFLOXACIN IN D5W 400 MG/200ML IV SOLN
400.0000 mg | Freq: Once | INTRAVENOUS | Status: AC
  Administered 2023-09-10: 400 mg via INTRAVENOUS
  Filled 2023-09-10: qty 200

## 2023-09-10 MED ORDER — ONDANSETRON HCL 4 MG/2ML IJ SOLN
4.0000 mg | Freq: Four times a day (QID) | INTRAMUSCULAR | Status: DC | PRN
Start: 2023-09-10 — End: 2023-09-12

## 2023-09-10 MED ORDER — LIDOCAINE HCL (PF) 1 % IJ SOLN
10.0000 mL | Freq: Once | INTRAMUSCULAR | Status: AC
  Administered 2023-09-10: 10 mL via INTRADERMAL
  Filled 2023-09-10: qty 10

## 2023-09-10 MED ORDER — HYDROMORPHONE HCL 1 MG/ML IJ SOLN
0.5000 mg | INTRAMUSCULAR | Status: DC | PRN
  Administered 2023-09-10 – 2023-09-12 (×6): 1 mg via INTRAVENOUS
  Filled 2023-09-10 (×6): qty 1

## 2023-09-10 NOTE — ED Triage Notes (Signed)
Pt c/o R elbow edema x3 wks. Denies any falls or injuries. States he developed fever & chills last night. Has tried IBU w/o relief.

## 2023-09-10 NOTE — Consult Note (Signed)
ORTHOPAEDIC CONSULTATION  REQUESTING PHYSICIAN: Chesley Noon, MD  Chief Complaint:   Right elbow pain and swelling.  History of Present Illness: Harry Peters is a 62 y.o. male with a history of gout, but otherwise in excellent health who lives independently with his wife and works as a Psychologist, educational.  The patient apparently began to notice some increased swelling around his right elbow over the past 2 weeks.  However, over the past 2 days, he developed significantly worsening pain, swelling, and erythema over the posterior aspect of his elbow extending proximally and distally.  He also developed some low-grade fevers and chills.  Therefore, he presented to a local urgent care clinic.  Concern was raised for possible septic right elbow so the patient was referred to [facility] emergency room for more definitive evaluation and treatment of his right elbow symptoms.  The patient denies any specific trauma to the elbow, especially any scratches or penetrating trauma.  He does have a history of gout, but feels that the present symptoms in his elbow are much different than the symptoms of gout he experiences in his foot.  He denies any numbness or paresthesias down his arm to his hand.  Past Medical History:  Diagnosis Date   Motion sickness    Rough seas   Wears dentures    full upper, partial lower   Past Surgical History:  Procedure Laterality Date   CARDIAC CATHETERIZATION  11/2013   Neg - [facility]   COLONOSCOPY     MOUTH SURGERY     SESAMOIDECTOMY Right 04/07/2015   Procedure: SESAMOIDECTOMY;  Surgeon: Recardo Evangelist, DPM;  Location: [facility];  Service: Podiatry;  Laterality: Right;   Social History   Socioeconomic History   Marital status: Married    Spouse name: Not on file   Number of children: Not on file   Years of education: Not on file   Highest education level: Not on file  Occupational  History   Not on file  Tobacco Use   Smoking status: Former    Current packs/day: 0.00    Types: Cigarettes    Quit date: 11/05/2005    Years since quitting: 17.8   Smokeless tobacco: Never  Vaping Use   Vaping status: Never Used  Substance and Sexual Activity   Alcohol use: Yes    Alcohol/week: 2.0 standard drinks of alcohol    Types: 2 Shots of liquor per week   Drug use: Not on file   Sexual activity: Not on file  Other Topics Concern   Not on file  Social History Narrative   Not on file   Social Determinants of Health   Financial Resource Strain: Not on file  Food Insecurity: Not on file  Transportation Needs: Not on file  Physical Activity: Not on file  Stress: Not on file  Social Connections: Not on file   History reviewed. No pertinent family history. Allergies  Allergen Reactions   Penicillins Hives, Shortness Of Breath, Swelling and Other (See Comments)    Patient had the reaction as a child and is not able to answer the follow up questions    Prior to Admission medications   Medication Sig Start Date End Date Taking? Authorizing Provider  ondansetron (ZOFRAN ODT) 4 MG disintegrating tablet Take 1 tablet (4 mg total) by mouth every 8 (eight) hours as needed for nausea or vomiting. 12/18/15   Gayla Doss, MD  oxyCODONE (ROXICODONE) 5 MG immediate release tablet Take 1 tablet (5 mg  total) by mouth every 6 (six) hours as needed for moderate pain. Do not drive while taking this medication. 12/18/15   Gayla Doss, MD  oxyCODONE-acetaminophen (PERCOCET) 5-325 MG tablet Take 1 tablet by mouth every 4 (four) hours as needed. 12/14/19   Nita Sickle, MD  tamsulosin (FLOMAX) 0.4 MG CAPS capsule Take 1 capsule (0.4 mg total) by mouth daily. 12/18/15   Gayla Doss, MD  triamcinolone cream (KENALOG) 0.1 % Apply 1 Application topically 2 (two) times daily. 08/09/22   Eusebio Friendly B, PA-C   No results found.  Positive ROS: All other systems have been reviewed and  were otherwise negative with the exception of those mentioned in the HPI and as above.  Physical Exam: General:  Alert, no acute distress Psychiatric:  Patient is competent for consent with normal mood and affect   Cardiovascular:  No pedal edema Respiratory:  No wheezing, non-labored breathing GI:  Abdomen is soft and non-tender Skin:  No lesions in the area of chief complaint Neurologic:  Sensation intact distally Lymphatic:  No axillary or cervical lymphadenopathy  Orthopedic Exam:  Orthopedic examination is limited to the right elbow and upper extremity.  Skin infection around the right elbow is notable for a large area of swelling and erythema over the posterior aspect of the elbow extending from approximately 6 cm proximal to the olecranon to approximately 10 cm distal to the olecranon and extending to the medial and lateral midline.  There are no areas of abrasions, lacerations, or other sites of skin violation.  Of he has moderate tenderness to palpation in this area.  He is able to actively tolerate passive elbow range of motion from 40-90 degrees, but has pain posteriorly at the extremes of these motions.  He is grossly neurovascularly intact to the right forearm and hand.  X-rays:  AP, lateral, and oblique views of the right elbow are available for review and have been reviewed by myself.  These films demonstrate no evidence of fractures, lytic lesions, or significant degenerative changes.  No obvious elbow effusion is noted by my review, although the official reading is not yet available.  Assessment: Probable septic olecranon bursitis right elbow.  Plan: The treatment options have been discussed with the patient.  Based on his physical examination, I feel that the most likely scenario is that he has a septic olecranon bursitis.  I feel that this would be best managed with IV antibiotic therapy.  Given the extent and location of the cellulitis, especially given the lower likelihood  of an intra-articular infection, I do not feel comfortable aspirating the elbow at this time.  Given his history of gout, I do feel it is worthwhile adding a serum uric acid level to his blood work.  In addition, I would recommend obtaining a CT scan of the right upper extremity and elbow to evaluate for any loculated fluid collections in the posterior aspect of the elbow as well as to better determine if there is any obvious elbow effusion.  Thank you for asking me to participate in the care of this most pleasant yet unfortunate man.  I will be happy to follow him with you.   Maryagnes Amos, MD  Beeper #:  [phone]  09/10/2023 5:52 PM

## 2023-09-10 NOTE — ED Provider Notes (Signed)
Presenting for pain of elbow for the past couple weeks.  Patient says overnight he developed swelling, redness, increased warmth of elbow, pain, chills, fatigue and aches.  Also reports feeling feverish.  Current temp 99.5 degrees.  Quick examination reveals large area of erythema, swelling and warmth of right elbow with inability to extend elbow.  Significant concern for septic arthritis.  Advised patient to proceed to the emergency department for further evaluation and treatment.  He has a penicillin allergy which will make this more difficult to treat as well.  Patient is agreeable and will go to [facility] at this time.   Shirlee Latch, PA-C 09/10/23 1328

## 2023-09-10 NOTE — ED Notes (Signed)
Patient is being discharged from the Urgent Care and sent to the Emergency Department via POV . Per Athena Masse PA, patient is in need of higher level of care due to septic arthritis R elbow. Patient is aware and verbalizes understanding of plan of care.  Vitals:   09/10/23 1242  BP: 121/77  Pulse: 85  Resp: 16  Temp: 99.5 F (37.5 C)  SpO2: 98%

## 2023-09-10 NOTE — Assessment & Plan Note (Signed)
Previous history of gout noted, not on chronic management.  - Uric acid level pending

## 2023-09-10 NOTE — H&P (Signed)
History and Physical    Patient: Harry Peters WUX:[id number] DOB: 1961/06/26 DOA: 09/10/2023 DOS: the patient was seen and examined on 09/10/2023 PCP: Jerl Mina, MD  Patient coming from: Home  Chief Complaint:  Chief Complaint  Patient presents with   Joint Swelling   HPI: Harry Peters is a 62 y.o. male with medical history significant of OSA not on CPAP, hyperlipidemia, gout, who presents to the ED due to joint swelling.  [name] states that starting approximately 2 weeks ago, he began to experience intermittent right elbow pain and swelling. He did not think much of it initially as symptoms were wax/wane, however the severity as been worsening. Over the last 2 days, his symptoms rapidly worsened including spreading of swelling to his forearm. He is having difficulty bending his elbow.  He endorses fevers that began yesterday at midnight into today at 5 AM.  He also began to experience nausea with dizziness and headache but denies any vomiting, abdominal pain, chest pain, shortness of breath, palpitations.  He denies any known trauma to the right elbow.  He denies any recent scratches from any animals or prolonged outdoor exposure.  No recent dental work  ED course: On arrival to the ED, patient was normotensive at 121/77 with heart rate of 85.  He was saturating at 98% on room air.  He was afebrile at 99.5. Initial workup demonstrated WBC of 11.6, glucose of 110, creatinine 1.06 with GFR above 60.  Lactic acid negative.  Right elbow x-ray obtained and pending.  ESR elevated at 28.  Orthopedic surgery consulted with recommendations to continue IV antibiotics.  [facility] contacted for admission.  Review of Systems: As mentioned in the history of present illness. All other systems reviewed and are negative.  Past Medical History:  Diagnosis Date   Motion sickness    Rough seas   Wears dentures    full upper, partial lower   Past Surgical History:  Procedure  Laterality Date   CARDIAC CATHETERIZATION  11/2013   Neg - [facility]   COLONOSCOPY     MOUTH SURGERY     SESAMOIDECTOMY Right 04/07/2015   Procedure: SESAMOIDECTOMY;  Surgeon: Recardo Evangelist, DPM;  Location: [facility];  Service: Podiatry;  Laterality: Right;   Social History:  reports that he quit smoking about 17 years ago. His smoking use included cigarettes. He has never used smokeless tobacco. He reports current alcohol use of about 2.0 standard drinks of alcohol per week. No history on file for drug use.  Allergies  Allergen Reactions   Penicillins Hives, Shortness Of Breath, Swelling and Other (See Comments)    Patient had the reaction as a child and is not able to answer the follow up questions     History reviewed. No pertinent family history.  Prior to Admission medications   Medication Sig Start Date End Date Taking? Authorizing Provider  ondansetron (ZOFRAN ODT) 4 MG disintegrating tablet Take 1 tablet (4 mg total) by mouth every 8 (eight) hours as needed for nausea or vomiting. 12/18/15   Gayla Doss, MD  oxyCODONE (ROXICODONE) 5 MG immediate release tablet Take 1 tablet (5 mg total) by mouth every 6 (six) hours as needed for moderate pain. Do not drive while taking this medication. 12/18/15   Gayla Doss, MD  oxyCODONE-acetaminophen (PERCOCET) 5-325 MG tablet Take 1 tablet by mouth every 4 (four) hours as needed. 12/14/19   Nita Sickle, MD  tamsulosin (FLOMAX) 0.4 MG CAPS capsule Take 1 capsule (  0.4 mg total) by mouth daily. 12/18/15   Gayla Doss, MD  triamcinolone cream (KENALOG) 0.1 % Apply 1 Application topically 2 (two) times daily. 08/09/22   Shirlee Latch, PA-C    Physical Exam: Vitals:   09/10/23 1346 09/10/23 1347 09/10/23 1752  BP:  (!) 143/76 (!) 140/70  Pulse:  87 88  Resp:  18 18  Temp:  99.2 F (37.3 C) 99 F (37.2 C)  TempSrc:  Oral Oral  SpO2:  100% 100%  Weight: 117.9 kg    Height: 6\' 2"  (1.88 m)     Physical Exam Vitals and  nursing note reviewed.  Constitutional:      General: He is not in acute distress.    Appearance: He is not toxic-appearing.  HENT:     Head: Normocephalic and atraumatic.  Eyes:     Conjunctiva/sclera: Conjunctivae normal.     Pupils: Pupils are equal, round, and reactive to light.  Cardiovascular:     Rate and Rhythm: Normal rate and regular rhythm.     Heart sounds: No murmur heard.    No gallop.  Pulmonary:     Effort: Pulmonary effort is normal. No respiratory distress.     Breath sounds: Normal breath sounds. No wheezing, rhonchi or rales.  Musculoskeletal:     Right elbow: Swelling (Right elbow with pronounced swelling extending up to the mid upper arm and down to the mid forearm circumferentially with overlying erythema and increased warmth to touch) present. Decreased range of motion (Unable to straighten elbow or pronate). Tenderness (Throughout) present.     Left elbow: Normal.  Skin:    General: Skin is warm and dry.  Neurological:     General: No focal deficit present.     Mental Status: He is alert and oriented to person, place, and time.  Psychiatric:        Mood and Affect: Mood normal.        Behavior: Behavior normal.    Data Reviewed: CBC with WBC of 11.6, hemoglobin of 15.6, MCV of 86, platelets of 229 CMP with sodium of 137, potassium 3.7, bicarb 27, glucose 110, BUN 11, creatinine 1.06, AST 14, ALT 15 and GFR above 60 Lactic acid 1.1 ESR 28  Right elbow x-ray pending Right elbow CT pending  Results are pending, will review when available.  Assessment and Plan:  * Septic bursitis Patient is presenting with rapidly worsening right elbow swelling, tenderness, and erythema in addition to an elevated WBC and ESR.  Uncertain etiology as no recent injections (right knee injection and arthrocentesis on 06/26/2023), trauma etc.  History of gout noted.  - Orthopedic surgery consulted; appreciate their recommendations - CT of the right elbow pending - Continue  vancomycin per pharmacy dosing - Continue ciprofloxacin 400 mg 2 times daily given penicillin allergy - Pain control with Tylenol, oxycodone and Dilaudid  Gout Previous history of gout noted, not on chronic management.  - Uric acid level pending  Sleep apnea No recent use of CPAP noted.  Advance Care Planning:   Code Status: Full Code   Consults: Orthopedic surgery  Family Communication: Patient's wife updated at bedside  Severity of Illness: The appropriate patient status for this patient is OBSERVATION. Observation status is judged to be reasonable and necessary in order to provide the required intensity of service to ensure the patient's safety. The patient's presenting symptoms, physical exam findings, and initial radiographic and laboratory data in the context of their medical condition is felt  to place them at decreased risk for further clinical deterioration. Furthermore, it is anticipated that the patient will be medically stable for discharge from the hospital within 2 midnights of admission.   Author: Verdene Lennert, MD 09/10/2023 7:04 PM  For on call review [email or web address].

## 2023-09-10 NOTE — Progress Notes (Signed)
Pharmacy Antibiotic Note  Harry Peters is a 62 y.o. male admitted on 09/10/2023 with  septic joint . Patient presented with history of intermittent elbow pain and swelling starting 2 weeks ago. No history of trauma. Orthopedic surgery recommended continued IV antibiotics for septic bursitis. Patient has a reported significant penicillin allergy. Pharmacy has been consulted for vancomycin dosing.  Today, 09/10/2023 Scr 1.06 (baseline) WBC 11.6 (elevated) 11/5 R elbow image: edema may represent cellulitis  Plan: Day 1 of antibiotics Give 1750 mg IV Q12H. Goal AUC 400-600. Expected AUC: 536.8 Expected Css min: 15.9 SCr used: 1.06  Weight used: IBW, Vd used: 0.72 (BMI 33.4) Patient is also on Ciprofloxacin 400 mg IV Q12H Continue to monitor renal function and follow culture results  Height: 6\' 2"  (188 cm) Weight: 117.9 kg (260 lb) IBW/kg (Calculated) : 82.2  Temp (24hrs), Avg:99.1 F (37.3 C), Min:98.6 F (37 C), Max:99.5 F (37.5 C)  Recent Labs  Lab 09/10/23 1349  WBC 11.6*  CREATININE 1.06  LATICACIDVEN 1.1    Estimated Creatinine Clearance: 102.4 mL/min (by C-G formula based on SCr of 1.06 mg/dL).    Allergies  Allergen Reactions   Penicillins Hives, Shortness Of Breath, Swelling and Other (See Comments)    Patient had the reaction as a child and is not able to answer the follow up questions     Antimicrobials this admission: Ciprofloxacin 11/5 >>  Vancomycin 11/5 >>   Dose adjustments this admission: NA  Microbiology results: 11/5 BCx: pending  Thank you for allowing pharmacy to be a part of this patient's care.  Effie Shy, PharmD Pharmacy Resident  09/10/2023 8:22 PM

## 2023-09-10 NOTE — Assessment & Plan Note (Signed)
No recent use of CPAP noted.

## 2023-09-10 NOTE — ED Provider Notes (Signed)
[facility] Provider Note    Event Date/Time   First MD Initiated Contact with Patient 09/10/23 1553     (approximate)   History   Chief Complaint Joint Swelling   HPI  Harry Peters is a 62 y.o. male with no significant past medical history presents to the ED complaining of joint swelling.  Patient reports that he has had about 2 weeks of waxing and waning swelling around his right elbow that has gotten acutely worse over the past 2 days.  In the past 48 hours, he has had progressive redness, swelling, warmth, and tenderness around his right elbow and extending into his forearm as well as his upper arm.  Range of motion in his elbow has been severely limited due to pain and he reports fevers and chills starting yesterday.  He states that he went to urgent care earlier today, where he was found to have a fever of 100.5 and sent to the ED due to concern for septic arthritis.     Physical Exam   Triage Vital Signs: ED Triage Vitals  Encounter Vitals Group     BP 09/10/23 1347 (!) 143/76     Systolic BP Percentile --      Diastolic BP Percentile --      Pulse Rate 09/10/23 1347 87     Resp 09/10/23 1347 18     Temp 09/10/23 1347 99.2 F (37.3 C)     Temp Source 09/10/23 1347 Oral     SpO2 09/10/23 1347 100 %     Weight 09/10/23 1346 260 lb (117.9 kg)     Height 09/10/23 1346 6\' 2"  (1.88 m)     Head Circumference --      Peak Flow --      Pain Score 09/10/23 1310 8     Pain Loc --      Pain Education --      Exclude from Growth Chart --     Most recent vital signs: Vitals:   09/10/23 1347  BP: (!) 143/76  Pulse: 87  Resp: 18  Temp: 99.2 F (37.3 C)  SpO2: 100%    Constitutional: Alert and oriented. Eyes: Conjunctivae are normal. Head: Atraumatic. Nose: No congestion/rhinnorhea. Mouth/Throat: Mucous membranes are moist.  Cardiovascular: Normal rate, regular rhythm. Grossly normal heart sounds.  2+ radial pulses  bilaterally. Respiratory: Normal respiratory effort.  No retractions. Lungs CTAB. Gastrointestinal: Soft and nontender. No distention. Musculoskeletal: Erythema, edema, warmth, and tenderness across right elbow diffusely with erythema and warmth extending to mid forearm as well as mid upper arm.  Range of motion of right elbow severely limited due to pain.  No lower extremity tenderness nor edema.  Neurologic:  Normal speech and language. No gross focal neurologic deficits are appreciated.    ED Results / Procedures / Treatments   Labs (all labs ordered are listed, but only abnormal results are displayed) Labs Reviewed  COMPREHENSIVE METABOLIC PANEL - Abnormal; Notable for the following components:      Result Value   Glucose, Bld 110 (*)    Calcium 8.8 (*)    AST 14 (*)    All other components within normal limits  CBC WITH DIFFERENTIAL/PLATELET - Abnormal; Notable for the following components:   WBC 11.6 (*)    Neutro Abs 8.4 (*)    Abs Immature Granulocytes 0.10 (*)    All other components within normal limits  SEDIMENTATION RATE - Abnormal; Notable for the following components:  Sed Rate 28 (*)    All other components within normal limits  CULTURE, BLOOD (ROUTINE X 2)  CULTURE, BLOOD (ROUTINE X 2)  LACTIC ACID, PLASMA  C-REACTIVE PROTEIN  URIC ACID   RADIOLOGY Right elbow x-ray reviewed and interpreted by me with no fracture or dislocation.  PROCEDURES:  Critical Care performed: Yes, see critical care procedure note(s)  .Critical Care  Performed by: Chesley Noon, MD Authorized by: Chesley Noon, MD   Critical care provider statement:    Critical care time (minutes):  30   Critical care time was exclusive of:  Separately billable procedures and treating other patients and teaching time   Critical care was necessary to treat or prevent imminent or life-threatening deterioration of the following conditions: Septic arthritis.   Critical care was time spent  personally by me on the following activities:  Development of treatment plan with patient or surrogate, discussions with consultants, evaluation of patient's response to treatment, examination of patient, ordering and review of laboratory studies, ordering and review of radiographic studies, ordering and performing treatments and interventions, pulse oximetry, re-evaluation of patient's condition and review of old charts    MEDICATIONS ORDERED IN ED: Medications  oxyCODONE-acetaminophen (PERCOCET/ROXICET) 5-325 MG per tablet 1 tablet (1 tablet Oral Given 09/10/23 1512)  ciprofloxacin (CIPRO) IVPB 400 mg (has no administration in time range)  vancomycin (VANCOCIN) IVPB 1000 mg/200 mL premix (has no administration in time range)  lidocaine (PF) (XYLOCAINE) 1 % injection 10 mL (10 mLs Intradermal Given by Other 09/10/23 1656)     IMPRESSION / MDM / ASSESSMENT AND PLAN / ED COURSE  I reviewed the triage vital signs and the nursing notes.                              62 y.o. male with no significant past medical history who presents to the ED complaining of 2 days of pain and swelling in his right elbow with associated redness, warmth, and fevers.  Patient's presentation is most consistent with acute presentation with potential threat to life or bodily function.  Differential diagnosis includes, but is not limited to, septic arthritis, septic bursitis, cellulitis, abscess.  Patient nontoxic-appearing and in no acute distress, vital signs are unremarkable.  He has significant redness and swelling consistent with infection to his right elbow and I am concerned for septic arthritis given evidence of joint effusion with severely limited range of motion.  Septic bursitis also considered but seems less likely at this time given no appreciable swollen bursa on exam.X-ray is unremarkable by my read, labs show mild leukocytosis with no significant anemia, electrolyte abnormality, or AKI.  Lactic acid within  normal limits, blood cultures, ESR, and CRP are pending at this time.  Case discussed with Dr. Joice Lofts of orthopedics, who will evaluate the patient, asks that we hold off on antibiotics until fluid obtained.  Patient evaluated by Dr. Joice Lofts of orthopedics, who recommends further assessment with CT imaging but favors septic bursitis rather than septic arthritis at this time.  We will treat with IV Cipro and vancomycin given his anaphylaxis to penicillins.  ESR elevated consistent with infection, case discussed with hospitalist for admission.      FINAL CLINICAL IMPRESSION(S) / ED DIAGNOSES   Final diagnoses:  Septic bursitis of elbow, right     Rx / DC Orders   ED Discharge Orders     None        Note:  This document was prepared using Dragon voice recognition software and may include unintentional dictation errors.   Chesley Noon, MD 09/10/23 [phone]

## 2023-09-10 NOTE — ED Notes (Signed)
See triage note  Presents with swelling to right elbow  for a few days  Fever at home   and presents with low grade temp

## 2023-09-10 NOTE — ED Triage Notes (Signed)
Patient to ED via POV for right elbow swelling x2 weeks. Redness and swelling noted. Pt also had fever and chills that started yesterday.   1 set of cultures sent with labs.

## 2023-09-10 NOTE — Assessment & Plan Note (Addendum)
Patient is presenting with rapidly worsening right elbow swelling, tenderness, and erythema in addition to an elevated WBC and ESR.  Uncertain etiology as no recent injections (right knee injection and arthrocentesis on 06/26/2023), trauma etc.  History of gout noted.  - Orthopedic surgery consulted; appreciate their recommendations - CT of the right elbow pending - Continue vancomycin per pharmacy dosing - Continue ciprofloxacin 400 mg 2 times daily given penicillin allergy - Pain control with Tylenol, oxycodone and Dilaudid

## 2023-09-10 NOTE — Progress Notes (Signed)
CODE SEPSIS - PHARMACY COMMUNICATION  **Broad Spectrum Antibiotics should be administered within 1 hour of Sepsis diagnosis**  Time Code Sepsis Called/Page Received: 1746 11/5  Antibiotics Ordered:  Ciprofloxacin 400mg  x1 Vancomycin 1000 mg x1  Time of 1st antibiotic administration: 1841  Additional action taken by pharmacy: NA  If necessary, Name of Provider/Nurse Contacted: NA  Effie Shy, PharmD Pharmacy Resident  09/10/2023 6:10 PM

## 2023-09-10 NOTE — Discharge Instructions (Signed)
You have been advised to follow up immediately in the emergency department for concerning signs.symptoms. If you declined EMS transport, please have a family member take you directly to the ED at this time. Do not delay. Based on concerns about condition, if you do not follow up in th e ED, you may risk poor outcomes including worsening of condition, delayed treatment and potentially life threatening issues. If you have declined to go to the ED at this time, you should call your PCP immediately to set up a follow up appointment. ° °Go to ED for red flag symptoms, including; fevers you cannot reduce with Tylenol/Motrin, severe headaches, vision changes, numbness/weakness in part of the body, lethargy, confusion, intractable vomiting, severe dehydration, chest pain, breathing difficulty, severe persistent abdominal or pelvic pain, signs of severe infection (increased redness, swelling of an area), feeling faint or passing out, dizziness, etc. You should especially go to the ED for sudden acute worsening of condition if you do not elect to go at this time.  °

## 2023-09-11 ENCOUNTER — Inpatient Hospital Stay: Payer: No Typology Code available for payment source

## 2023-09-11 DIAGNOSIS — Z880 Allergy status to penicillin: Secondary | ICD-10-CM | POA: Diagnosis not present

## 2023-09-11 DIAGNOSIS — M71121 Other infective bursitis, right elbow: Principal | ICD-10-CM

## 2023-09-11 DIAGNOSIS — L03113 Cellulitis of right upper limb: Secondary | ICD-10-CM | POA: Diagnosis present

## 2023-09-11 DIAGNOSIS — M0089 Polyarthritis due to other bacteria: Secondary | ICD-10-CM | POA: Diagnosis not present

## 2023-09-11 DIAGNOSIS — M71129 Other infective bursitis, unspecified elbow: Secondary | ICD-10-CM | POA: Diagnosis present

## 2023-09-11 DIAGNOSIS — R739 Hyperglycemia, unspecified: Secondary | ICD-10-CM | POA: Diagnosis present

## 2023-09-11 DIAGNOSIS — M009 Pyogenic arthritis, unspecified: Secondary | ICD-10-CM | POA: Diagnosis present

## 2023-09-11 DIAGNOSIS — Z87891 Personal history of nicotine dependence: Secondary | ICD-10-CM | POA: Diagnosis not present

## 2023-09-11 DIAGNOSIS — G8929 Other chronic pain: Secondary | ICD-10-CM | POA: Diagnosis present

## 2023-09-11 DIAGNOSIS — E669 Obesity, unspecified: Secondary | ICD-10-CM | POA: Diagnosis present

## 2023-09-11 DIAGNOSIS — Z6833 Body mass index (BMI) 33.0-33.9, adult: Secondary | ICD-10-CM | POA: Diagnosis not present

## 2023-09-11 DIAGNOSIS — M94261 Chondromalacia, right knee: Secondary | ICD-10-CM | POA: Diagnosis present

## 2023-09-11 DIAGNOSIS — Z23 Encounter for immunization: Secondary | ICD-10-CM | POA: Diagnosis not present

## 2023-09-11 DIAGNOSIS — G4733 Obstructive sleep apnea (adult) (pediatric): Secondary | ICD-10-CM | POA: Diagnosis present

## 2023-09-11 DIAGNOSIS — Z79899 Other long term (current) drug therapy: Secondary | ICD-10-CM | POA: Diagnosis not present

## 2023-09-11 DIAGNOSIS — M25462 Effusion, left knee: Secondary | ICD-10-CM | POA: Diagnosis present

## 2023-09-11 DIAGNOSIS — M7110 Other infective bursitis, unspecified site: Secondary | ICD-10-CM | POA: Diagnosis present

## 2023-09-11 DIAGNOSIS — R6889 Other general symptoms and signs: Secondary | ICD-10-CM | POA: Diagnosis not present

## 2023-09-11 DIAGNOSIS — R7303 Prediabetes: Secondary | ICD-10-CM | POA: Diagnosis present

## 2023-09-11 DIAGNOSIS — M19021 Primary osteoarthritis, right elbow: Secondary | ICD-10-CM | POA: Diagnosis present

## 2023-09-11 DIAGNOSIS — M109 Gout, unspecified: Secondary | ICD-10-CM | POA: Diagnosis present

## 2023-09-11 LAB — BASIC METABOLIC PANEL
Anion gap: 8 (ref 5–15)
BUN: 12 mg/dL (ref 6–20)
CO2: 27 mmol/L (ref 22–32)
Calcium: 8.5 mg/dL — ABNORMAL LOW (ref 8.9–10.3)
Chloride: 101 mmol/L (ref 98–111)
Creatinine, Ser: 1.14 mg/dL (ref 0.61–1.24)
GFR, Estimated: >60 mL/min (ref >60)
Glucose, Bld: 160 mg/dL — ABNORMAL HIGH (ref 70–99)
Potassium: 4 mmol/L (ref 3.5–5.1)
Sodium: 136 mmol/L (ref 135–145)

## 2023-09-11 LAB — CBC WITH DIFFERENTIAL/PLATELET
Abs Immature Granulocytes: 0.09 10*3/uL — ABNORMAL HIGH (ref 0.00–0.07)
Basophils Absolute: 0.1 10*3/uL (ref 0.0–0.1)
Basophils Relative: 1 %
Eosinophils Absolute: 0.2 10*3/uL (ref 0.0–0.5)
Eosinophils Relative: 2 %
HCT: 42.1 % (ref 39.0–52.0)
Hemoglobin: 14.4 g/dL (ref 13.0–17.0)
Immature Granulocytes: 1 %
Lymphocytes Relative: 17 %
Lymphs Abs: 1.6 10*3/uL (ref 0.7–4.0)
MCH: 30.3 pg (ref 26.0–34.0)
MCHC: 34.2 g/dL (ref 30.0–36.0)
MCV: 88.4 fL (ref 80.0–100.0)
Monocytes Absolute: 0.8 10*3/uL (ref 0.1–1.0)
Monocytes Relative: 8 %
Neutro Abs: 7 10*3/uL (ref 1.7–7.7)
Neutrophils Relative %: 71 %
Platelets: 192 10*3/uL (ref 150–400)
RBC: 4.76 MIL/uL (ref 4.22–5.81)
RDW: 13.8 % (ref 11.5–15.5)
WBC: 9.7 10*3/uL (ref 4.0–10.5)
nRBC: 0 % (ref 0.0–0.2)

## 2023-09-11 LAB — HIV ANTIBODY (ROUTINE TESTING W REFLEX): HIV Screen 4th Generation wRfx: NONREACTIVE

## 2023-09-11 LAB — GLUCOSE, CAPILLARY: Glucose-Capillary: 155 mg/dL — ABNORMAL HIGH (ref 70–99)

## 2023-09-11 MED ORDER — INFLUENZA VIRUS VACC SPLIT PF (FLUZONE) 0.5 ML IM SUSY
0.5000 mL | PREFILLED_SYRINGE | INTRAMUSCULAR | Status: AC
  Administered 2023-09-12: 0.5 mL via INTRAMUSCULAR
  Filled 2023-09-11: qty 0.5

## 2023-09-11 MED ORDER — CLINDAMYCIN PHOSPHATE 600 MG/50ML IV SOLN
600.0000 mg | Freq: Three times a day (TID) | INTRAVENOUS | Status: DC
  Administered 2023-09-11 – 2023-09-14 (×8): 600 mg via INTRAVENOUS
  Filled 2023-09-11 (×10): qty 50

## 2023-09-11 MED ORDER — COLCHICINE 0.6 MG PO TABS
0.6000 mg | ORAL_TABLET | Freq: Every day | ORAL | Status: DC
  Administered 2023-09-11 – 2023-09-15 (×5): 0.6 mg via ORAL
  Filled 2023-09-11 (×5): qty 1

## 2023-09-11 NOTE — Plan of Care (Signed)
  Problem: Education: Goal: Knowledge of General Education information will improve Description: Including pain rating scale, medication(s)/side effects and non-pharmacologic comfort measures Outcome: Progressing   Problem: Health Behavior/Discharge Planning: Goal: Ability to manage health-related needs will improve Outcome: Progressing   Problem: Clinical Measurements: Goal: Ability to maintain clinical measurements within normal limits will improve Outcome: Progressing   Problem: Activity: Goal: Risk for activity intolerance will decrease Outcome: Progressing   Problem: Nutrition: Goal: Adequate nutrition will be maintained Outcome: Progressing   Problem: Coping: Goal: Level of anxiety will decrease Outcome: Progressing   Problem: Pain Management: Goal: General experience of comfort will improve Outcome: Progressing   Problem: Safety: Goal: Ability to remain free from injury will improve Outcome: Progressing   Problem: Education: Goal: Knowledge of General Education information will improve Description: Including pain rating scale, medication(s)/side effects and non-pharmacologic comfort measures Outcome: Progressing   Problem: Health Behavior/Discharge Planning: Goal: Ability to manage health-related needs will improve Outcome: Progressing   Problem: Clinical Measurements: Goal: Ability to maintain clinical measurements within normal limits will improve Outcome: Progressing   Problem: Activity: Goal: Risk for activity intolerance will decrease Outcome: Progressing   Problem: Nutrition: Goal: Adequate nutrition will be maintained Outcome: Progressing   Problem: Coping: Goal: Level of anxiety will decrease Outcome: Progressing   Problem: Pain Management: Goal: General experience of comfort will improve Outcome: Progressing   Problem: Safety: Goal: Ability to remain free from injury will improve Outcome: Progressing

## 2023-09-11 NOTE — Progress Notes (Signed)
PROGRESS NOTE  Harry Peters    DOB: 18-Jun-1961, 62 y.o.  ZOX:[id number]    Code Status: Full Code   DOA: 09/10/2023   LOS: 0   Brief hospital course  Harry Peters is a 62 y.o. male with medical history significant of OSA not on CPAP, hyperlipidemia, gout.  They presented to the ED due to R elbow swelling worsening x 2 days.  He endorses fevers. He denies any known trauma to the right elbow.  He denies any recent scratches from any animals or prolonged outdoor exposure.  No recent dental work   ED course: normotensive at 121/77 with heart rate of 85. saturating at 98% on room air.  He was afebrile at 99.5. Initial workup demonstrated WBC of 11.6, glucose of 110, creatinine 1.06 with GFR above 60.  Lactic acid negative. ESR elevated at 28.   Right elbow x-ray nothing acute R elbow CT: Marked subcutaneous edema throughout the dorsum of the elbow and proximal forearm. No fluid collection or abscess. 2. No evidence of acute fracture or osteomyelitis. 3. Mild osteoarthritis.  Orthopedic surgery consulted with recommendations to continue IV antibiotics.  [facility] contacted for admission.  They were initially treated with vancomycin and ciprofloxacin.   Patient was admitted to medicine service for further workup and management of Probable septic olecranon bursitis right elbow as outlined in detail below.  09/11/23 -stable  Assessment & Plan  Principal Problem:   Septic bursitis Active Problems:   Sleep apnea   Gout   Septic bursitis of elbow  Septic olecranon bursitis R elbow- no fluid collection seen on imaging. He states that the swelling is worsening since admission. His pain is well managed with the pain medications. Ortho has evaluated.  - transitioned to clindamycin due to penicillin allergy - elevated elbow above heart - recheck tomorrow morning  - analgesia PRN - mobilize   Gout- mild uric acid increase on admission Previous history of gout noted, not on chronic  management. - antiinflammatory trial for contribution   Sleep apnea No recent use of CPAP noted.  Body mass index is 33.38 kg/m.  VTE ppx: SCDs Start: 09/10/23 1845  Diet:     Diet   Diet Heart Room service appropriate? Yes; Fluid consistency: Thin   Consultants: Ortho   Subjective 09/11/23    Pt reports pain well controlled with meds. Swelling has spread both distal and proximal to elbow.    Objective   Vitals:   09/10/23 1929 09/10/23 1930 09/10/23 2343 09/11/23 0743  BP: 122/65 122/65 112/82 117/68  Pulse: 78 74 83 78  Resp: 18  20 18   Temp: 98.6 F (37 C)  99.6 F (37.6 C) 99 F (37.2 C)  TempSrc:   Oral   SpO2: 99% 93% 95% 97%  Weight:      Height:        Intake/Output Summary (Last 24 hours) at 09/11/2023 1336 Last data filed at 09/10/2023 2051 Gross per 24 hour  Intake 200 ml  Output --  Net 200 ml   Filed Weights   09/10/23 1346  Weight: 117.9 kg     Physical Exam:  General: awake, alert, NAD HEENT: atraumatic, clear conjunctiva, anicteric sclera, MMM, hearing grossly normal Respiratory: normal respiratory effort. Cardiovascular: quick capillary refill, normal S1/S2, RRR, no JVD, murmurs Nervous: A&O x3. no gross focal neurologic deficits, normal speech Extremities: tenderness to palpation, erythema, increased heat and non-pitting edema to R elbow area. Rom intact but painful, no open wound Psychiatry: normal  mood, congruent affect  Labs   I have personally reviewed the following labs and imaging studies CBC    Component Value Date/Time   WBC 9.7 09/11/2023 0506   RBC 4.76 09/11/2023 0506   HGB 14.4 09/11/2023 0506   HGB 14.1 11/18/2013 0435   HCT 42.1 09/11/2023 0506   HCT 40.7 11/18/2013 0435   PLT 192 09/11/2023 0506   PLT 143 (L) 11/18/2013 0435   MCV 88.4 09/11/2023 0506   MCV 87 11/18/2013 0435   MCH 30.3 09/11/2023 0506   MCHC 34.2 09/11/2023 0506   RDW 13.8 09/11/2023 0506   RDW 14.6 (H) 11/18/2013 0435   LYMPHSABS 1.6  09/11/2023 0506   LYMPHSABS 1.8 11/18/2013 0435   MONOABS 0.8 09/11/2023 0506   MONOABS 0.4 11/18/2013 0435   EOSABS 0.2 09/11/2023 0506   EOSABS 0.2 11/18/2013 0435   BASOSABS 0.1 09/11/2023 0506   BASOSABS 0.1 11/18/2013 0435      Latest Ref Rng & Units 09/11/2023    5:06 AM 09/10/2023    1:49 PM 12/18/2015    4:31 AM  BMP  Glucose 70 - 99 mg/dL 161  096  045   BUN 6 - 20 mg/dL 12  11  15    Creatinine 0.61 - 1.24 mg/dL 4.09  8.11  9.14   Sodium 135 - 145 mmol/L 136  137  142   Potassium 3.5 - 5.1 mmol/L 4.0  3.7  4.2   Chloride 98 - 111 mmol/L 101  102  110   CO2 22 - 32 mmol/L 27  27  24    Calcium 8.9 - 10.3 mg/dL 8.5  8.8  9.1     CT EBLOW RIGHT W CONTRAST  Result Date: 09/10/2023 CLINICAL DATA:  Right elbow swelling for 2 weeks, worsening pain and erythema EXAM: CT OF THE UPPER RIGHT EXTREMITY WITH CONTRAST TECHNIQUE: Multidetector CT imaging of the upper right extremity was performed according to the standard protocol following intravenous contrast administration. RADIATION DOSE REDUCTION: This exam was performed according to the departmental dose-optimization program which includes automated exposure control, adjustment of the mA and/or kV according to patient size and/or use of iterative reconstruction technique. CONTRAST:  OMNIPAQUE IOHEXOL 300 MG/ML  SOLN COMPARISON:  09/10/2023 FINDINGS: Bones/Joint/Cartilage There are no acute or destructive bony abnormalities. Mild osteoarthritis, with spurring along the margins of the olecranon and proximal ulna. Enthesopathic changes are seen at the olecranon. No evidence of joint effusion. Ligaments Suboptimally assessed by CT. Muscles and Tendons No acute findings. Soft tissues There is marked subcutaneous edema throughout the dorsum of the elbow and proximal forearm. No evidence of fluid collection or abscess. Vascular structures enhance normally. Reconstructed images demonstrate no additional findings. IMPRESSION: 1. Marked  subcutaneous edema throughout the dorsum of the elbow and proximal forearm. No fluid collection or abscess. 2. No evidence of acute fracture or osteomyelitis. 3. Mild osteoarthritis. Electronically Signed   By: Sharlet Salina M.D.   On: 09/10/2023 20:50   DG Elbow Complete Right  Result Date: 09/10/2023 CLINICAL DATA:  Swelling and warmth.  Fever and chills. EXAM: RIGHT ELBOW - COMPLETE 3+ VIEW COMPARISON:  None Available. FINDINGS: Diffuse subcutaneous edema about the elbow. No acute fracture or dislocation. No osseous destruction. No joint effusion. IMPRESSION: No acute osseous abnormality. Diffuse subcutaneous edema may represent cellulitis. Electronically Signed   By: Jeronimo Greaves M.D.   On: 09/10/2023 18:13    Disposition Plan & Communication  Patient status: Inpatient  Admitted From: Home Planned disposition  location: Home Anticipated discharge date: 11/7 pending clinical improvement for transition to PO Abx course  Family Communication: wife at bedside    Author: Leeroy Bock, DO [facility] 09/11/2023, 1:36 PM   Available by Epic secure chat 7AM-7PM. If 7PM-7AM, please contact night-coverage.  [facility] contact information found on [email or web address].

## 2023-09-11 NOTE — Progress Notes (Signed)
Patient ID: Harry Peters, male   DOB: [birth year], 62 y.o.   MRN: [id number]  Subjective: The patient notes continued moderate to severe pain in the posterior aspect of his elbow which waxes and wanes.  He continues to have difficulty with elbow range of motion due to the pain.  He had an episode of chills and rigors last night, but is feeling better this morning.   Objective: Vital signs in last 24 hours: Temp:  [98.6 F (37 C)-99.6 F (37.6 C)] 99 F (37.2 C) (11/06 0743) Pulse Rate:  [74-88] 78 (11/06 0743) Resp:  [18-20] 18 (11/06 0743) BP: (112-140)/(65-82) 117/68 (11/06 0743) SpO2:  [93 %-100 %] 97 % (11/06 0743)  Intake/Output from previous day: 11/05 0701 - 11/06 0700 In: 200 [IV Piggyback:200] Out: -  Intake/Output this shift: No intake/output data recorded.  Recent Labs    09/10/23 1349 09/11/23 0506  HGB 15.6 14.4   Recent Labs    09/10/23 1349 09/11/23 0506  WBC 11.6* 9.7  RBC 5.21 4.76  HCT 45.2 42.1  PLT 229 192   Recent Labs    09/10/23 1349 09/11/23 0506  NA 137 136  K 3.7 4.0  CL 102 101  CO2 27 27  BUN 11 12  CREATININE 1.06 1.14  GLUCOSE 110* 160*  CALCIUM 8.8* 8.5*   No results for input(s): "LABPT", "INR" in the last 72 hours.  Physical Exam: Orthopedic examination again is limited to the right elbow and upper extremity.  The findings are unchanged as compared to yesterday.  He continues to exhibit moderate swelling, erythema, tenderness to palpation over the posterior aspect of his elbow extending proximally and distally into the upper arm and forearm respectively.  He is able to tolerate passive elbow motion from 45 to 90 degrees.  He remains grossly neurovascularly intact to the right upper extremity and hand.  X-rays: A recent CT scan of the left elbow and upper extremity is available for review and has been reviewed by myself.  By report, there is no evidence of joint effusion, thereby minimizing the likelihood of a septic right  elbow joint.  In addition, although there is moderate swelling diffusely in the posterior elbow soft tissues, no loculated fluid collection or other finding concerning for abscess is appreciated.  Assessment: Posterior right elbow cellulitis, most likely secondary to a septic olecranon bursitis.  Plan: At this point, there does not appear to be anything surgical required, given the lack of elbow effusion and lack of loculated fluid collections noted on the CT scan.  I would recommend that he continue to receive appropriate IV antibiotics for the next 48 to 72 hours to see how he responds.  Once he begins to respond well, he may be switched over to oral antibiotics.   Harry Peters 09/11/2023, 2:57 PM

## 2023-09-12 ENCOUNTER — Encounter
Admission: EM | Disposition: A | Discharge status: Home / Self Care | Payer: Self-pay | Source: Home / Self Care | Attending: Student in an Organized Health Care Education/Training Program

## 2023-09-12 ENCOUNTER — Encounter: Payer: Self-pay | Admitting: Student in an Organized Health Care Education/Training Program

## 2023-09-12 ENCOUNTER — Inpatient Hospital Stay: Payer: No Typology Code available for payment source | Admitting: Certified Registered Nurse Anesthetist

## 2023-09-12 ENCOUNTER — Other Ambulatory Visit: Payer: Self-pay

## 2023-09-12 DIAGNOSIS — M0089 Polyarthritis due to other bacteria: Secondary | ICD-10-CM

## 2023-09-12 DIAGNOSIS — M7110 Other infective bursitis, unspecified site: Secondary | ICD-10-CM | POA: Diagnosis not present

## 2023-09-12 DIAGNOSIS — M009 Pyogenic arthritis, unspecified: Secondary | ICD-10-CM

## 2023-09-12 HISTORY — PX: KNEE ARTHROSCOPY: SHX127

## 2023-09-12 LAB — CBC
HCT: 40.7 % (ref 39.0–52.0)
Hemoglobin: 13.9 g/dL (ref 13.0–17.0)
MCH: 30.3 pg (ref 26.0–34.0)
MCHC: 34.2 g/dL (ref 30.0–36.0)
MCV: 88.7 fL (ref 80.0–100.0)
Platelets: 198 10*3/uL (ref 150–400)
RBC: 4.59 MIL/uL (ref 4.22–5.81)
RDW: 13.4 % (ref 11.5–15.5)
WBC: 9.4 10*3/uL (ref 4.0–10.5)
nRBC: 0 % (ref 0.0–0.2)

## 2023-09-12 LAB — CREATININE, SERUM
Creatinine, Ser: 1.19 mg/dL (ref 0.61–1.24)
GFR, Estimated: >60 mL/min (ref >60)

## 2023-09-12 LAB — SYNOVIAL CELL COUNT + DIFF, W/ CRYSTALS
Crystals, Fluid: NONE SEEN
Eosinophils-Synovial: 0 %
Lymphocytes-Synovial Fld: 1 %
Monocyte-Macrophage-Synovial Fluid: 5 %
Neutrophil, Synovial: 94 %
WBC, Synovial: 23618 /mm3 — ABNORMAL HIGH (ref 0–200)

## 2023-09-12 LAB — URIC ACID: Uric Acid, Serum: 9 mg/dL — ABNORMAL HIGH (ref 3.7–8.6)

## 2023-09-12 LAB — GLUCOSE, CAPILLARY: Glucose-Capillary: 238 mg/dL — ABNORMAL HIGH (ref 70–99)

## 2023-09-12 LAB — CK: Total CK: 88 U/L (ref 49–397)

## 2023-09-12 SURGERY — ARTHROSCOPY, KNEE
Anesthesia: General | Site: Knee | Laterality: Left

## 2023-09-12 MED ORDER — KETOROLAC TROMETHAMINE 15 MG/ML IJ SOLN
15.0000 mg | Freq: Four times a day (QID) | INTRAMUSCULAR | Status: AC
  Administered 2023-09-12 – 2023-09-13 (×4): 15 mg via INTRAVENOUS
  Filled 2023-09-12 (×4): qty 1

## 2023-09-12 MED ORDER — FENTANYL CITRATE (PF) 100 MCG/2ML IJ SOLN
INTRAMUSCULAR | Status: AC
  Filled 2023-09-12: qty 2

## 2023-09-12 MED ORDER — LIDOCAINE HCL (CARDIAC) PF 100 MG/5ML IV SOSY
PREFILLED_SYRINGE | INTRAVENOUS | Status: DC | PRN
  Administered 2023-09-12: 50 mg via INTRAVENOUS

## 2023-09-12 MED ORDER — ONDANSETRON HCL 4 MG/2ML IJ SOLN
INTRAMUSCULAR | Status: AC
  Filled 2023-09-12: qty 2

## 2023-09-12 MED ORDER — CEFAZOLIN SODIUM 1 G IJ SOLR
INTRAMUSCULAR | Status: AC
  Filled 2023-09-12: qty 20

## 2023-09-12 MED ORDER — PROPOFOL 10 MG/ML IV BOLUS
INTRAVENOUS | Status: AC
Start: 2023-09-12 — End: ?
  Filled 2023-09-12: qty 20

## 2023-09-12 MED ORDER — MIDAZOLAM HCL 2 MG/2ML IJ SOLN
INTRAMUSCULAR | Status: DC | PRN
  Administered 2023-09-12: 2 mg via INTRAVENOUS

## 2023-09-12 MED ORDER — ACETAMINOPHEN 10 MG/ML IV SOLN
INTRAVENOUS | Status: AC
  Filled 2023-09-12: qty 100

## 2023-09-12 MED ORDER — RINGERS IRRIGATION IR SOLN
Status: DC | PRN
  Administered 2023-09-12: 3000 mL

## 2023-09-12 MED ORDER — PROPOFOL 10 MG/ML IV BOLUS
INTRAVENOUS | Status: DC | PRN
  Administered 2023-09-12: 200 mg via INTRAVENOUS

## 2023-09-12 MED ORDER — CLINDAMYCIN PHOSPHATE 600 MG/50ML IV SOLN
INTRAVENOUS | Status: AC
  Filled 2023-09-12: qty 50

## 2023-09-12 MED ORDER — MAGNESIUM HYDROXIDE 400 MG/5ML PO SUSP
30.0000 mL | Freq: Every day | ORAL | Status: DC | PRN
  Administered 2023-09-13: 30 mL via ORAL

## 2023-09-12 MED ORDER — LACTATED RINGERS IV SOLN: INTRAVENOUS | Status: DC | PRN

## 2023-09-12 MED ORDER — DEXAMETHASONE SODIUM PHOSPHATE 10 MG/ML IJ SOLN
INTRAMUSCULAR | Status: DC | PRN
  Administered 2023-09-12: 4 mg via INTRAVENOUS

## 2023-09-12 MED ORDER — BACID PO TABS: 2.0000 | ORAL_TABLET | Freq: Three times a day (TID) | ORAL | Status: DC

## 2023-09-12 MED ORDER — BUPIVACAINE-EPINEPHRINE (PF) 0.5% -1:200000 IJ SOLN
INTRAMUSCULAR | Status: AC
  Filled 2023-09-12: qty 10

## 2023-09-12 MED ORDER — ONDANSETRON HCL 4 MG PO TABS: 4.0000 mg | ORAL_TABLET | Freq: Four times a day (QID) | ORAL | Status: DC | PRN

## 2023-09-12 MED ORDER — FENTANYL CITRATE PF 50 MCG/ML IJ SOSY
PREFILLED_SYRINGE | INTRAMUSCULAR | Status: AC
  Filled 2023-09-12: qty 1

## 2023-09-12 MED ORDER — CEFAZOLIN SODIUM-DEXTROSE 2-3 GM-%(50ML) IV SOLR
INTRAVENOUS | Status: DC | PRN
  Administered 2023-09-12: 2 g via INTRAVENOUS

## 2023-09-12 MED ORDER — LIDOCAINE HCL (PF) 2 % IJ SOLN
INTRAMUSCULAR | Status: AC
  Filled 2023-09-12: qty 5

## 2023-09-12 MED ORDER — LIDOCAINE HCL (PF) 1 % IJ SOLN
INTRAMUSCULAR | Status: AC
  Filled 2023-09-12: qty 30

## 2023-09-12 MED ORDER — GENTAMICIN SULFATE 40 MG/ML IJ SOLN
INTRAMUSCULAR | Status: AC
Start: 2023-09-12 — End: ?
  Filled 2023-09-12: qty 4

## 2023-09-12 MED ORDER — ENOXAPARIN SODIUM 40 MG/0.4ML IJ SOSY
40.0000 mg | PREFILLED_SYRINGE | INTRAMUSCULAR | Status: DC
  Administered 2023-09-13 – 2023-09-14 (×2): 40 mg via SUBCUTANEOUS
  Filled 2023-09-12 (×3): qty 0.4

## 2023-09-12 MED ORDER — OXYCODONE HCL 5 MG PO TABS
5.0000 mg | ORAL_TABLET | ORAL | Status: DC | PRN
  Administered 2023-09-14: 5 mg via ORAL
  Filled 2023-09-12 (×2): qty 1

## 2023-09-12 MED ORDER — ACETAMINOPHEN 10 MG/ML IV SOLN
1000.0000 mg | Freq: Once | INTRAVENOUS | Status: DC | PRN
  Administered 2023-09-12: 1000 mg via INTRAVENOUS

## 2023-09-12 MED ORDER — METOCLOPRAMIDE HCL 5 MG PO TABS: 5.0000 mg | ORAL_TABLET | Freq: Three times a day (TID) | ORAL | Status: DC | PRN

## 2023-09-12 MED ORDER — RINGERS IRRIGATION IR SOLN
Status: DC | PRN
  Administered 2023-09-12: 3004 mL

## 2023-09-12 MED ORDER — ACETAMINOPHEN 500 MG PO TABS
1000.0000 mg | ORAL_TABLET | Freq: Four times a day (QID) | ORAL | Status: AC
  Administered 2023-09-12 – 2023-09-13 (×3): 1000 mg via ORAL
  Filled 2023-09-12 (×3): qty 2

## 2023-09-12 MED ORDER — BISACODYL 10 MG RE SUPP: 10.0000 mg | Freq: Every day | RECTAL | Status: DC | PRN

## 2023-09-12 MED ORDER — FLEET ENEMA RE ENEM: 1.0000 | ENEMA | Freq: Once | RECTAL | Status: DC | PRN

## 2023-09-12 MED ORDER — RISAQUAD PO CAPS
1.0000 | ORAL_CAPSULE | Freq: Every day | ORAL | Status: DC
  Administered 2023-09-13 – 2023-09-15 (×3): 1 via ORAL
  Filled 2023-09-12 (×4): qty 1

## 2023-09-12 MED ORDER — SODIUM CHLORIDE 0.9 % IV SOLN: INTRAVENOUS | Status: AC

## 2023-09-12 MED ORDER — ACETAMINOPHEN 325 MG PO TABS: 325.0000 mg | ORAL_TABLET | Freq: Four times a day (QID) | ORAL | Status: DC | PRN

## 2023-09-12 MED ORDER — METOCLOPRAMIDE HCL 5 MG/ML IJ SOLN: 5.0000 mg | Freq: Three times a day (TID) | INTRAMUSCULAR | Status: DC | PRN

## 2023-09-12 MED ORDER — FENTANYL CITRATE (PF) 100 MCG/2ML IJ SOLN
INTRAMUSCULAR | Status: DC | PRN
  Administered 2023-09-12 (×4): 50 ug via INTRAVENOUS

## 2023-09-12 MED ORDER — ONDANSETRON HCL 4 MG/2ML IJ SOLN: 4.0000 mg | Freq: Four times a day (QID) | INTRAMUSCULAR | Status: DC | PRN

## 2023-09-12 MED ORDER — FENTANYL CITRATE (PF) 100 MCG/2ML IJ SOLN
25.0000 ug | INTRAMUSCULAR | Status: DC | PRN
  Administered 2023-09-12: 25 ug via INTRAVENOUS

## 2023-09-12 MED ORDER — KETOROLAC TROMETHAMINE 30 MG/ML IJ SOLN
INTRAMUSCULAR | Status: AC
  Filled 2023-09-12: qty 1

## 2023-09-12 MED ORDER — MIDAZOLAM HCL 2 MG/2ML IJ SOLN
INTRAMUSCULAR | Status: AC
  Filled 2023-09-12: qty 2

## 2023-09-12 MED ORDER — FENTANYL CITRATE PF 50 MCG/ML IJ SOSY
25.0000 ug | PREFILLED_SYRINGE | INTRAMUSCULAR | Status: DC | PRN
Start: 2023-09-12 — End: 2023-09-12
  Administered 2023-09-12: 25 ug via INTRAVENOUS

## 2023-09-12 MED ORDER — DOCUSATE SODIUM 100 MG PO CAPS
100.0000 mg | ORAL_CAPSULE | Freq: Two times a day (BID) | ORAL | Status: DC
  Administered 2023-09-12 – 2023-09-14 (×5): 100 mg via ORAL
  Filled 2023-09-12 (×6): qty 1

## 2023-09-12 MED ORDER — ONDANSETRON HCL 4 MG/2ML IJ SOLN
INTRAMUSCULAR | Status: DC | PRN
  Administered 2023-09-12: 4 mg via INTRAVENOUS

## 2023-09-12 MED ORDER — KETOROLAC TROMETHAMINE 30 MG/ML IJ SOLN
30.0000 mg | Freq: Once | INTRAMUSCULAR | Status: AC
  Administered 2023-09-12: 30 mg via INTRAVENOUS

## 2023-09-12 MED ORDER — LACTATED RINGERS IV SOLN: INTRAVENOUS | Status: DC

## 2023-09-12 MED ORDER — DIPHENHYDRAMINE HCL 12.5 MG/5ML PO ELIX: 12.5000 mg | ORAL_SOLUTION | ORAL | Status: DC | PRN

## 2023-09-12 SURGICAL SUPPLY — 47 items
APL PRP STRL LF DISP 70% ISPRP (MISCELLANEOUS) ×2
BLADE FULL RADIUS 3.5 (BLADE) ×1 IMPLANT
BLADE SHAVER 4.5X7 STR FR (MISCELLANEOUS) ×1 IMPLANT
BNDG CMPR 6 X 5 YARDS HK CLSR (GAUZE/BANDAGES/DRESSINGS) ×1
BNDG ELASTIC 6INX 5YD STR LF (GAUZE/BANDAGES/DRESSINGS) ×1 IMPLANT
BNDG ESMARCH 6 X 12 STRL LF (GAUZE/BANDAGES/DRESSINGS) ×1
BNDG ESMARCH 6X12 STRL LF (GAUZE/BANDAGES/DRESSINGS) ×1 IMPLANT
CATH ROBINSON RED A/P 12FR (CATHETERS) IMPLANT
CHLORAPREP W/TINT 26 (MISCELLANEOUS) ×1 IMPLANT
CNTNR URN SCR LID CUP LEK RST (MISCELLANEOUS) IMPLANT
COLLECTOR GRAFT TISSUE (SYSTAGENIX WOUND MANAGEMENT)
CONT SPEC 4OZ STRL OR WHT (MISCELLANEOUS) ×1
CUFF TOURN SGL QUICK 24 (TOURNIQUET CUFF)
CUFF TOURN SGL QUICK 34 (TOURNIQUET CUFF) ×1
CUFF TRNQT CYL 24X4X16.5-23 (TOURNIQUET CUFF) IMPLANT
CUFF TRNQT CYL 34X4.125X (TOURNIQUET CUFF) IMPLANT
CUFF TRNQT CYL 34X4X40X1 (TOURNIQUET CUFF) IMPLANT
CUP MEDICINE 2OZ PLAST GRAD ST (MISCELLANEOUS) ×1 IMPLANT
DRAPE ARTHROSCOPY W/POUCH 90 (DRAPES) ×1 IMPLANT
DRAPE IMP U-DRAPE 54X76 (DRAPES) ×1 IMPLANT
ELECT REM PT RETURN 9FT ADLT (ELECTROSURGICAL) ×1
ELECTRODE REM PT RTRN 9FT ADLT (ELECTROSURGICAL) ×1 IMPLANT
EVACUATOR 1/8 PVC DRAIN (DRAIN) IMPLANT
GAUZE SPONGE 4X4 12PLY STRL (GAUZE/BANDAGES/DRESSINGS) ×1 IMPLANT
GLOVE BIO SURGEON STRL SZ7.5 (GLOVE) IMPLANT
GLOVE BIO SURGEON STRL SZ8 (GLOVE) ×2 IMPLANT
GLOVE INDICATOR 8.0 STRL GRN (GLOVE) ×1 IMPLANT
GOWN STRL REUS W/ TWL LRG LVL3 (GOWN DISPOSABLE) ×1 IMPLANT
GOWN STRL REUS W/ TWL XL LVL3 (GOWN DISPOSABLE) ×2 IMPLANT
GOWN STRL REUS W/TWL LRG LVL3 (GOWN DISPOSABLE) ×1
GOWN STRL REUS W/TWL XL LVL3 (GOWN DISPOSABLE) ×1
IV LR IRRIG 3000ML ARTHROMATIC (IV SOLUTION) ×1 IMPLANT
KIT TURNOVER KIT A (KITS) ×1 IMPLANT
MANIFOLD NEPTUNE II (INSTRUMENTS) ×2 IMPLANT
NDL HYPO 21X1.5 SAFETY (NEEDLE) ×1 IMPLANT
NEEDLE HYPO 21X1.5 SAFETY (NEEDLE) ×1 IMPLANT
PACK ARTHROSCOPY KNEE (MISCELLANEOUS) ×1 IMPLANT
SLEEVE REMOTE CONTROL 5X12 (DRAPES) IMPLANT
SUT PROLENE 4 0 PS 2 18 (SUTURE) ×1 IMPLANT
SYR 30ML LL (SYRINGE) ×1 IMPLANT
SYR 50ML LL SCALE MARK (SYRINGE) ×1 IMPLANT
SYR TOOMEY 50ML (SYRINGE) IMPLANT
TISSUE GRAFT COLLECTOR (SYSTAGENIX WOUND MANAGEMENT) IMPLANT
TRAP FLUID SMOKE EVACUATOR (MISCELLANEOUS) ×1 IMPLANT
TUBE SET DOUBLEFLO INFLOW (TUBING) ×1 IMPLANT
WAND WEREWOLF FLOW 90D (MISCELLANEOUS) ×1 IMPLANT
WATER STERILE IRR 500ML POUR (IV SOLUTION) ×1 IMPLANT

## 2023-09-12 NOTE — Progress Notes (Signed)
Patient ID: Harry Peters, male   DOB: October 06, 1961, 62 y.o.   MRN: [id number]  Subjective: The patient notes that his right elbow symptoms are much improved today.  He is he feels that he is able to move his elbow more freely and that the "tightness" he was experiencing is much better.  His primary concern now is worsening left knee pain and swelling since last evening.  He first noted some twinges of discomfort when getting up to go the bathroom last evening.  By this morning, his symptoms are much worse.  He has difficulty putting any weight on the leg and cannot bend it.  He denies any specific injury to the knee, and denies any numbness paresthesias down his leg to his foot.   Objective: Vital signs in last 24 hours: Temp:  [98.9 F (37.2 C)-100.6 F (38.1 C)] 98.9 F (37.2 C) (11/07 0721) Pulse Rate:  [73-81] 73 (11/07 0721) Resp:  [16-19] 19 (11/07 0721) BP: (113-131)/(69-76) 113/69 (11/07 0721) SpO2:  [95 %-97 %] 97 % (11/07 0721)  Intake/Output from previous day: 11/06 0701 - 11/07 0700 In: 600.1 [IV Piggyback:600.1] Out: -  Intake/Output this shift: No intake/output data recorded.  Recent Labs    09/10/23 1349 09/11/23 0506 09/12/23 0520  HGB 15.6 14.4 13.9   Recent Labs    09/11/23 0506 09/12/23 0520  WBC 9.7 9.4  RBC 4.76 4.59  HCT 42.1 40.7  PLT 192 198   Recent Labs    09/10/23 1349 09/11/23 0506 09/12/23 0520  NA 137 136  --   K 3.7 4.0  --   CL 102 101  --   CO2 27 27  --   BUN 11 12  --   CREATININE 1.06 1.14 1.19  GLUCOSE 110* 160*  --   CALCIUM 8.8* 8.5*  --    No results for input(s): "LABPT", "INR" in the last 72 hours.  Physical Exam: Examination of his right elbow still demonstrates mild-moderate swelling over the posterior aspect of the elbow, but the erythema is improved.  Overall, the tissue is softer to palpation in the posterior aspect of his elbow.  He is able to range his elbow from approximately 25 to 100 degrees actively with  only minimal discomfort.  He is neurovascularly intact to the right upper extremity and hand.  Examination of his left knee is notable for mild swelling diffusely around the knee,.  There is no erythema, ecchymosis, abrasions, or other skin abnormalities identified.  He exhibits a 1-2+ effusion.  He has mild-moderate tenderness to palpation diffusely around the knee.  He has more severe pain with any attempted active or passive motion of the knee.  He is neurovascularly intact to the left lower extremity and foot.  X-rays: Recent x-rays of the left knee are available for review and have been reviewed by myself.  These films demonstrate mild to moderate degenerative changes, apparently evolving the lateral compartment as manifest by mild lateral compartment narrowing.  No fractures, lytic lesions, or other acute bony abnormalities are identified.  Assessment: 1.  Right posterior elbow cellulitis secondary to probable septic olecranon bursitis, improving symptomatically. 2.  Acute left knee effusion of unclear etiology.  Plan: The treatment options are reviewed with the patient and his wife who is at the bedside.  Regarding his right elbow, he is responding well to the IV antibiotics.  I feel that the IV antibiotic should be continued for another 24 to 48 hours before converting him to oral  antibiotics.  Regarding his left knee symptoms, he does have a history of gout.  However, given his recent bout of infection, a septic knee cannot be ruled out.  Therefore, after obtaining verbal consent, the left knee is aspirated.  Approximately 20 cc of straw-colored turbid fluid is obtained and sent for cell count and differential, crystals, Gram stain, and culture and sensitivity.   Excell Seltzer Shafter Jupin 09/12/2023, 1:19 PM   Addendum: The white cell count has come back showing 23,600 white cells with 94% neutrophils.  No crystals are observed in the fluid.  The Gram stain is still pending, but the absence of  bacteria on this study might not provide the full picture as he has been on IV antibiotics for the past several days.    Based on his clinical picture, I feel that it be most prudent to perform an arthroscopic irrigation and debridement of his left knee in case there is underlying infection.  This procedure has been discussed with the patient and his wife, as have the potential risks (including bleeding, infection, nerve and/or blood vessel injury, persistent or recurrent pain/infection, stiffness of the knee, need for further surgery, blood clots, strokes, heart attacks or arrhythmias, pneumonia, etc.) and benefits of the surgical procedure were discussed.  The patient states his understanding and agrees to proceed.  A formal written consent will be obtained by the nursing staff.   Excell Seltzer Marcia Lepera 09/12/2023, 1:29 PM

## 2023-09-12 NOTE — Anesthesia Procedure Notes (Signed)
Procedure Name: LMA Insertion Date/Time: 09/12/2023 4:03 PM  Performed by: Nelle Don, CRNAPre-anesthesia Checklist: Patient identified, Emergency Drugs available, Suction available and Patient being monitored Patient Re-evaluated:Patient Re-evaluated prior to induction Oxygen Delivery Method: Circle system utilized Preoxygenation: Pre-oxygenation with 100% oxygen Induction Type: IV induction LMA: LMA inserted LMA Size: 4.0 Number of attempts: 1 Dental Injury: Teeth and Oropharynx as per pre-operative assessment

## 2023-09-12 NOTE — Op Note (Signed)
09/12/2023  5:27 PM  Patient:   Harry Peters  Pre-Op Diagnosis:   Septic arthritis left knee.  Postoperative diagnosis:   Possible septic arthritis left knee with underlying gout  Procedure:   Arthroscopic irrigation and debridement of left knee.  Surgeon:   Maryagnes Amos, MD  Anesthesia:   General LMA  Findings:   As above.  There were multiple areas of crystalline deposition throughout the joint both in the soft tissues and in the articular cartilage.  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  Lesser degenerative changes of the patella and medial compartment also were noted.  There appeared to be an old tear of the posterior lateral portion of the lateral meniscus.  The medial meniscus appeared to be in satisfactory condition, as were the anterior and posterior cruciate ligaments.  Complications:   None  EBL:   10 cc.  Total fluids:   400 cc of crystalloid.  Tourniquet time:   None  Drains:   None  Closure:   4-0 Prolene interrupted sutures.  Brief clinical note:   The patient is a 62 year old male who was admitted 2 days ago for a right elbow cellulitis, presumably secondary to right olecranon bursitis.  The patient's symptoms were improving on IV antibiotics.  However, the last night, he began to develop increased left knee pain.  By this morning, he had severe pain in his knee and was unable to bear weight.  An aspiration was concerning for septic arthritis, although gouty arthritis could not be completely excluded.  The patient presents at this time for an arthroscopic irrigation and debridement of his left knee.  Procedure:   The patient was brought into the operating room and lain in the supine position.  After adequate general laryngeal mask anesthesia was obtained, the left  lower extremity was prepped with ChloraPrep solution before being draped sterilely.  Perioperative antibiotics were administered.  A timeout was performed to verify the appropriate surgical site while the leg was held in an elevated position.  The tourniquet was then inflated to 300 mmHg.  The camera was placed in the anterolateral portal, all instrumentation was performed through the anteromedial portal.  Upon inserting the camera, a moderate amount of straw-colored turbid fluid was collected and sent for culture and sensitivity.  The knee was sequentially examined beginning in the suprapatellar pouch, then progressing to the patellofemoral space, the medial gutter and compartment, the notch, and finally the lateral compartment and gutter.  The findings were as described above.  The knee was irrigated thoroughly using 3 L of antibiotic irrigation and 3 L of sterile saline solution.  A partial synovectomy was performed and areas of loose articular cartilage were lightly debrided using the full-radius resector.  The instruments were then removed from the joint after suctioning the excess fluid.  The portal sites were closed using 4-0 Prolene interrupted sutures before a sterile bulky dressing was applied to the knee.  One limb to a Hemovac drain was placed through the anterolateral portal prior to closure.  The patient was then awakened, extubated, and returned to the recovery room in satisfactory condition after tolerating the procedure well.

## 2023-09-12 NOTE — Progress Notes (Addendum)
PROGRESS NOTE  Harry Peters    DOB: 06-22-1961, 62 y.o.  ZOX:[id number]    Code Status: Full Code   DOA: 09/10/2023   LOS: 1   Brief hospital course  Harry Peters is a 62 y.o. male with medical history significant of OSA not on CPAP, hyperlipidemia, gout.  They presented to the ED due to R elbow swelling worsening x 2 days.  He endorses fevers. He denies any known trauma to the right elbow.  He denies any recent scratches from any animals or prolonged outdoor exposure.  No recent dental work   ED course: normotensive at 121/77 with heart rate of 85. saturating at 98% on room air.  He was afebrile at 99.5. Initial workup demonstrated WBC of 11.6, glucose of 110, creatinine 1.06 with GFR above 60.  Lactic acid negative. ESR elevated at 28.   Right elbow x-ray nothing acute R elbow CT: Marked subcutaneous edema throughout the dorsum of the elbow and proximal forearm. No fluid collection or abscess. 2. No evidence of acute fracture or osteomyelitis. 3. Mild osteoarthritis.  Orthopedic surgery consulted.  Patient was admitted to medicine service for further workup and management of Probable septic olecranon bursitis right elbow as outlined in detail below.  They were initially treated with vancomycin and ciprofloxacin. Transitioned to clindamycin 11/6 and had significant improvement.  09/12/23 - woke up with significant bilateral knee pain and mild effusion on the left. Usual location of gout is foot. Has been on colchicine since yesterday. Unable to bear weight this am from pain. Ortho aspirated and appears like gout attack.   Assessment & Plan  Principal Problem:   Septic bursitis Active Problems:   Sleep apnea   Gout   Septic bursitis of elbow   Septic bursitis of elbow, right  Septic olecranon bursitis R elbow- pain and swelling much improved. Ortho has evaluated.  - transitioned to clindamycin due to penicillin allergy - elevated elbow above heart - recheck  tomorrow morning  - analgesia PRN - mobilize  Bilateral knee pain- began mild yesterday and significantly worsened this am. L knee xray 11/6 without abnormality. gout was suspected but no crystals seen on aspiration collected 11/7. Synovial fluid showing WBC highly elevated to 23,618. ESR and CRP were elevated on admission but none-specific. Blood cultures remain negative x2 days.  Septic arthritis? Culture for joint aspiration pending - continue IV Abx - continue ortho consult - PT/OT once improving - CK pending   Gout- mild uric acid increase on admission Previous history of gout noted, not on chronic management.  - antiinflammatory trial for contribution   Sleep apnea No recent use of CPAP noted.  Body mass index is 33.38 kg/m.  VTE ppx: SCDs Start: 09/10/23 1845  Diet:     Diet   Diet Heart Room service appropriate? Yes; Fluid consistency: Thin   Consultants: Ortho   Subjective 09/12/23    Pt reports improvement in pain and swelling in R elbow. Able to use his hand. Having difficulty with weight bearing on his legs today due to acute worsening of bilateral knee pain, worse on left. Typical gout flares are in feet.    Objective   Vitals:   09/11/23 0743 09/11/23 1553 09/11/23 2301 09/12/23 0721  BP: 117/68 126/70 131/76 113/69  Pulse: 78 81 75 73  Resp: 18 16 16 19   Temp: 99 F (37.2 C) (!) 100.6 F (38.1 C) 99.1 F (37.3 C) 98.9 F (37.2 C)  TempSrc:  SpO2: 97% 96% 95% 97%  Weight:      Height:        Intake/Output Summary (Last 24 hours) at 09/12/2023 1610 Last data filed at 09/12/2023 0300 Gross per 24 hour  Intake 600.11 ml  Output --  Net 600.11 ml   Filed Weights   09/10/23 1346  Weight: 117.9 kg    Physical Exam:  General: awake, alert, NAD HEENT: atraumatic, clear conjunctiva, anicteric sclera, MMM, hearing grossly normal Respiratory: normal respiratory effort. Cardiovascular: quick capillary refill Nervous: A&O x3. no gross focal  neurologic deficits, normal speech Extremities: no tenderness to palpation of R elbow. Continues to have pitting edema but improved erythema and warmth from yesterday.  R knee- no effusion appreciated, tender to touch. ROM intact but painful L knee- mild to moderate effusion, very tender to touch, ROM limited due to pain. Sensation intact. Distal perfusion intact. Increased warmth to touch.  Psychiatry: normal mood, congruent affect  Labs   I have personally reviewed the following labs and imaging studies CBC    Component Value Date/Time   WBC 9.4 09/12/2023 0520   RBC 4.59 09/12/2023 0520   HGB 13.9 09/12/2023 0520   HGB 14.1 11/18/2013 0435   HCT 40.7 09/12/2023 0520   HCT 40.7 11/18/2013 0435   PLT 198 09/12/2023 0520   PLT 143 (L) 11/18/2013 0435   MCV 88.7 09/12/2023 0520   MCV 87 11/18/2013 0435   MCH 30.3 09/12/2023 0520   MCHC 34.2 09/12/2023 0520   RDW 13.4 09/12/2023 0520   RDW 14.6 (H) 11/18/2013 0435   LYMPHSABS 1.6 09/11/2023 0506   LYMPHSABS 1.8 11/18/2013 0435   MONOABS 0.8 09/11/2023 0506   MONOABS 0.4 11/18/2013 0435   EOSABS 0.2 09/11/2023 0506   EOSABS 0.2 11/18/2013 0435   BASOSABS 0.1 09/11/2023 0506   BASOSABS 0.1 11/18/2013 0435      Latest Ref Rng & Units 09/12/2023    5:20 AM 09/11/2023    5:06 AM 09/10/2023    1:49 PM  BMP  Glucose 70 - 99 mg/dL  960  454   BUN 6 - 20 mg/dL  12  11   Creatinine 0.98 - 1.24 mg/dL 1.19  1.47  8.29   Sodium 135 - 145 mmol/L  136  137   Potassium 3.5 - 5.1 mmol/L  4.0  3.7   Chloride 98 - 111 mmol/L  101  102   CO2 22 - 32 mmol/L  27  27   Calcium 8.9 - 10.3 mg/dL  8.5  8.8     DG Knee 1-2 Views Left  Result Date: 09/11/2023 CLINICAL DATA:  Knee pain.  No known injury. EXAM: LEFT KNEE - 1-2 VIEW COMPARISON:  None Available. FINDINGS: There is no acute fracture or dislocation. There is arthritic changes of the knee with moderate narrowing of the lateral compartment. Small joint effusion. The soft tissues are  unremarkable. IMPRESSION: 1. No acute fracture or dislocation. 2. Arthritic changes of the knee with moderate narrowing of the lateral compartment. Electronically Signed   By: Elgie Collard M.D.   On: 09/11/2023 21:51   CT EBLOW RIGHT W CONTRAST  Result Date: 09/10/2023 CLINICAL DATA:  Right elbow swelling for 2 weeks, worsening pain and erythema EXAM: CT OF THE UPPER RIGHT EXTREMITY WITH CONTRAST TECHNIQUE: Multidetector CT imaging of the upper right extremity was performed according to the standard protocol following intravenous contrast administration. RADIATION DOSE REDUCTION: This exam was performed according to the departmental dose-optimization program which  includes automated exposure control, adjustment of the mA and/or kV according to patient size and/or use of iterative reconstruction technique. CONTRAST:  OMNIPAQUE IOHEXOL 300 MG/ML  SOLN COMPARISON:  09/10/2023 FINDINGS: Bones/Joint/Cartilage There are no acute or destructive bony abnormalities. Mild osteoarthritis, with spurring along the margins of the olecranon and proximal ulna. Enthesopathic changes are seen at the olecranon. No evidence of joint effusion. Ligaments Suboptimally assessed by CT. Muscles and Tendons No acute findings. Soft tissues There is marked subcutaneous edema throughout the dorsum of the elbow and proximal forearm. No evidence of fluid collection or abscess. Vascular structures enhance normally. Reconstructed images demonstrate no additional findings. IMPRESSION: 1. Marked subcutaneous edema throughout the dorsum of the elbow and proximal forearm. No fluid collection or abscess. 2. No evidence of acute fracture or osteomyelitis. 3. Mild osteoarthritis. Electronically Signed   By: Sharlet Salina M.D.   On: 09/10/2023 20:50   DG Elbow Complete Right  Result Date: 09/10/2023 CLINICAL DATA:  Swelling and warmth.  Fever and chills. EXAM: RIGHT ELBOW - COMPLETE 3+ VIEW COMPARISON:  None Available. FINDINGS: Diffuse  subcutaneous edema about the elbow. No acute fracture or dislocation. No osseous destruction. No joint effusion. IMPRESSION: No acute osseous abnormality. Diffuse subcutaneous edema may represent cellulitis. Electronically Signed   By: Jeronimo Greaves M.D.   On: 09/10/2023 18:13    Disposition Plan & Communication  Patient status: Inpatient  Admitted From: Home Planned disposition location: Home Anticipated discharge date: 11/9 pending clinical improvement for transition to PO Abx course  Family Communication: wife at bedside    Author: Leeroy Bock, DO [facility] 09/12/2023, 7:22 AM   Available by Epic secure chat 7AM-7PM. If 7PM-7AM, please contact night-coverage.  [facility] contact information found on [email or web address].

## 2023-09-12 NOTE — Plan of Care (Signed)
  Problem: Education: Goal: Knowledge of General Education information will improve Description: Including pain rating scale, medication(s)/side effects and non-pharmacologic comfort measures Outcome: Progressing   Problem: Health Behavior/Discharge Planning: Goal: Ability to manage health-related needs will improve Outcome: Progressing   Problem: Clinical Measurements: Goal: Ability to maintain clinical measurements within normal limits will improve Outcome: Progressing   Problem: Coping: Goal: Level of anxiety will decrease Outcome: Progressing   Problem: Elimination: Goal: Will not experience complications related to bowel motility Outcome: Progressing   Problem: Safety: Goal: Ability to remain free from injury will improve Outcome: Progressing   

## 2023-09-12 NOTE — Transfer of Care (Signed)
Immediate Anesthesia Transfer of Care Note  Patient: Harry Peters  Procedure(s) Performed: LEFT KNEE ARTHROSCOPY WASH OUT (Left: Knee)  Patient Location: PACU  Anesthesia Type:General  Level of Consciousness: drowsy  Airway & Oxygen Therapy: Patient Spontanous Breathing and Patient connected to face mask oxygen  Post-op Assessment: Report given to RN, Post -op Vital signs reviewed and stable, and Patient moving all extremities X 4  Post vital signs: Reviewed and stable  Last Vitals:  Vitals Value Taken Time  BP 108/71 09/12/23 1709  Temp 39.2 C 09/12/23 1709  Pulse 80 09/12/23 1712  Resp 15 09/12/23 1713  SpO2 94 % 09/12/23 1712  Vitals shown include unfiled device data.  Last Pain:  Vitals:   09/12/23 1509  TempSrc: Tympanic  PainSc: 8       Patients Stated Pain Goal: 0 (09/11/23 1742)  Complications: No notable events documented.

## 2023-09-12 NOTE — Anesthesia Postprocedure Evaluation (Signed)
Anesthesia Post Note  Patient: Harry Peters  Procedure(s) Performed: LEFT KNEE ARTHROSCOPY WASH OUT (Left: Knee)  Patient location during evaluation: PACU Anesthesia Type: General Level of consciousness: awake and alert Pain management: pain level controlled Vital Signs Assessment: post-procedure vital signs reviewed and stable Respiratory status: spontaneous breathing, nonlabored ventilation, respiratory function stable and patient connected to nasal cannula oxygen Cardiovascular status: blood pressure returned to baseline and stable Postop Assessment: no apparent nausea or vomiting Anesthetic complications: no   No notable events documented.   Last Vitals:  Vitals:   09/12/23 2023 09/12/23 2331  BP:  (!) 90/56  Pulse:  62  Resp:  20  Temp: 36.7 C 36.4 C  SpO2:  95%    Last Pain:  Vitals:   09/12/23 2335  TempSrc:   PainSc: 2                  Lenard Simmer

## 2023-09-12 NOTE — Plan of Care (Signed)
  Problem: Education: Goal: Knowledge of General Education information will improve Description: Including pain rating scale, medication(s)/side effects and non-pharmacologic comfort measures Outcome: Progressing   Problem: Health Behavior/Discharge Planning: Goal: Ability to manage health-related needs will improve Outcome: Progressing   Problem: Clinical Measurements: Goal: Ability to maintain clinical measurements within normal limits will improve Outcome: Progressing Goal: Will remain free from infection Outcome: Progressing Goal: Diagnostic test results will improve Outcome: Progressing Goal: Respiratory complications will improve Outcome: Progressing Goal: Cardiovascular complication will be avoided Outcome: Progressing   Problem: Activity: Goal: Risk for activity intolerance will decrease Outcome: Progressing   Problem: Coping: Goal: Level of anxiety will decrease Outcome: Progressing   Problem: Elimination: Goal: Will not experience complications related to bowel motility Outcome: Progressing Goal: Will not experience complications related to urinary retention Outcome: Progressing   Problem: Safety: Goal: Ability to remain free from injury will improve Outcome: Progressing   Problem: Skin Integrity: Goal: Risk for impaired skin integrity will decrease Outcome: Progressing   Problem: Education: Goal: Knowledge of General Education information will improve Description: Including pain rating scale, medication(s)/side effects and non-pharmacologic comfort measures Outcome: Progressing   Problem: Clinical Measurements: Goal: Ability to maintain clinical measurements within normal limits will improve Outcome: Progressing Goal: Will remain free from infection Outcome: Progressing Goal: Diagnostic test results will improve Outcome: Progressing Goal: Respiratory complications will improve Outcome: Progressing Goal: Cardiovascular complication will be  avoided Outcome: Progressing   Problem: Coping: Goal: Level of anxiety will decrease Outcome: Progressing   Problem: Elimination: Goal: Will not experience complications related to bowel motility Outcome: Progressing Goal: Will not experience complications related to urinary retention Outcome: Progressing   Problem: Skin Integrity: Goal: Risk for impaired skin integrity will decrease Outcome: Progressing

## 2023-09-12 NOTE — Anesthesia Preprocedure Evaluation (Addendum)
Anesthesia Evaluation  Patient identified by MRN, date of birth, ID band Patient awake    Reviewed: Allergy & Precautions, H&P , NPO status , Patient's Chart, lab work & pertinent test results, reviewed documented beta blocker date and time   History of Anesthesia Complications Negative for: history of anesthetic complications  Airway Mallampati: III  TM Distance: >3 FB Neck ROM: full    Dental  (+) Dental Advidsory Given, Edentulous Upper, Edentulous Lower   Pulmonary neg shortness of breath, sleep apnea , neg COPD, neg recent URI, former smoker (quit 2007)   Pulmonary exam normal breath sounds clear to auscultation       Cardiovascular Exercise Tolerance: Good negative cardio ROS Normal cardiovascular exam Rhythm:regular Rate:Normal     Neuro/Psych Chronic pain negative neurological ROS  negative psych ROS   GI/Hepatic negative GI ROS, Neg liver ROS,,,  Endo/Other  negative endocrine ROS  Obesity   Renal/GU negative Renal ROS  negative genitourinary   Musculoskeletal  (+) Arthritis ,  Gout    Abdominal   Peds  Hematology negative hematology ROS (+)   Anesthesia Other Findings Past Medical History: No date: Motion sickness     Comment:  [place]ough [place] No date: Wears dentures     Comment:  full upper, partial lower   Reproductive/Obstetrics negative OB ROS                             Anesthesia Physical Anesthesia Plan  ASA: 2  Anesthesia Plan: General   Post-op Pain Management:    Induction: Intravenous  PONV Risk Score and Plan: 2 and Ondansetron, Dexamethasone, Midazolam and Treatment may vary due to age or medical condition  Airway Management Planned: LMA  Additional Equipment:   Intra-op Plan:   Post-operative Plan: Extubation in OR  Informed Consent: I have reviewed the patients History and Physical, chart, labs and discussed the procedure including the risks,  benefits and alternatives for the proposed anesthesia with the patient or authorized representative who has indicated his/her understanding and acceptance.     Dental Advisory Given  Plan Discussed with: Anesthesiologist, CRNA and Surgeon  Anesthesia Plan Comments:        Anesthesia Quick Evaluation

## 2023-09-13 ENCOUNTER — Encounter: Payer: Self-pay | Admitting: Surgery

## 2023-09-13 DIAGNOSIS — M7110 Other infective bursitis, unspecified site: Secondary | ICD-10-CM | POA: Diagnosis not present

## 2023-09-13 LAB — BASIC METABOLIC PANEL
Anion gap: 8 (ref 5–15)
BUN: 18 mg/dL (ref 6–20)
CO2: 25 mmol/L (ref 22–32)
Calcium: 8.6 mg/dL — ABNORMAL LOW (ref 8.9–10.3)
Chloride: 99 mmol/L (ref 98–111)
Creatinine, Ser: 1.24 mg/dL (ref 0.61–1.24)
GFR, Estimated: >60 mL/min (ref >60)
Glucose, Bld: 230 mg/dL — ABNORMAL HIGH (ref 70–99)
Potassium: 4.9 mmol/L (ref 3.5–5.1)
Sodium: 132 mmol/L — ABNORMAL LOW (ref 135–145)

## 2023-09-13 LAB — CBC
HCT: 44.3 % (ref 39.0–52.0)
Hemoglobin: 15 g/dL (ref 13.0–17.0)
MCH: 29.6 pg (ref 26.0–34.0)
MCHC: 33.9 g/dL (ref 30.0–36.0)
MCV: 87.5 fL (ref 80.0–100.0)
Platelets: 226 10*3/uL (ref 150–400)
RBC: 5.06 MIL/uL (ref 4.22–5.81)
RDW: 13.3 % (ref 11.5–15.5)
WBC: 8.3 10*3/uL (ref 4.0–10.5)
nRBC: 0 % (ref 0.0–0.2)

## 2023-09-13 LAB — HEMOGLOBIN A1C
Hgb A1c MFr Bld: 5.8 % — ABNORMAL HIGH (ref 4.8–5.6)
Mean Plasma Glucose: 119.76 mg/dL

## 2023-09-13 MED ORDER — MAGNESIUM HYDROXIDE 400 MG/5ML PO SUSP
15.0000 mL | Freq: Every day | ORAL | Status: DC | PRN
  Administered 2023-09-14: 15 mL via ORAL
  Filled 2023-09-13 (×2): qty 30

## 2023-09-13 NOTE — Progress Notes (Addendum)
PROGRESS NOTE  Harry Peters    DOB: 11-25-1960, 62 y.o.  MWU:[id number]    Code Status: Full Code   DOA: 09/10/2023   LOS: 2   Brief hospital course  Harry Peters is a 62 y.o. male with medical history significant of OSA not on CPAP, hyperlipidemia, gout.  They presented to the ED due to R elbow swelling worsening x 2 days.  He endorses fevers. He denies any known trauma to the right elbow.  He denies any recent scratches from any animals or prolonged outdoor exposure.  No recent dental work   ED course: normotensive at 121/77 with heart rate of 85. saturating at 98% on room air.  He was afebrile at 99.5. Initial workup demonstrated WBC of 11.6, glucose of 110, creatinine 1.06 with GFR above 60.  Lactic acid negative. ESR elevated at 28.   Right elbow x-ray nothing acute R elbow CT: Marked subcutaneous edema throughout the dorsum of the elbow and proximal forearm. No fluid collection or abscess. 2. No evidence of acute fracture or osteomyelitis. 3. Mild osteoarthritis.  Orthopedic surgery consulted.  Patient was admitted to medicine service for further workup and management of Probable septic olecranon bursitis right elbow as outlined in detail below.  They were initially treated with vancomycin and ciprofloxacin. Transitioned to clindamycin 11/6 and had significant improvement.  09/13/23 - woke up with significant bilateral knee pain and mild effusion on the left. Usual location of gout is foot. Has been on colchicine since yesterday. Unable to bear weight this am from pain. Ortho aspirated and appears like gout attack.   Assessment & Plan  Principal Problem:   Septic bursitis Active Problems:   Sleep apnea   Gout   Septic bursitis of elbow   Septic bursitis of elbow, right   Pyogenic arthritis of multiple sites (HCC)  Septic olecranon bursitis R elbow- pain and swelling much improved. Ortho has evaluated.  - transitioned to clindamycin due to penicillin allergy -  elevated elbow above heart - recheck tomorrow morning  - analgesia PRN - mobilize 11/8 - no erythema or warmth, swelling appears resolved, improved ROM  Bilateral knee pain- began mild 11/6 afternoon and significantly worsened by AM 11/7, pt unable to bear weight or ambulate.  L knee xray 11/6 without abnormality.  Gout was suspected but no crystals seen on aspiration collected 11/7. Synovial fluid showing WBC highly elevated to 23,618. ESR and CRP were elevated on admission but non-specific.   Blood cultures remain negative x 3 days.   CK normal Septic arthritis? - needs to be ruled out.    --Culture for joint aspiration pending - follow - continue IV Abx  - Ortho is following -- took pt to OR on 11/7 for washout - PT/OT once improving   Gout- mild uric acid increase on admission Previous history of gout noted, not on chronic management.  - antiinflammatory trial with scheduled Toradol - avoiding steroids due to infection with septic elbow  Pre-diabetes / Hyperglycemia Last Hbg A1c Oct 2023 was 5.7% Note fasting glucose on BMP this AM 230 --Repeat A1c pending --Hold of sliding scale insulin for now, will discuss with patient first   Sleep apnea No recent use of CPAP noted.  Body mass index is 33.38 kg/m.  VTE ppx: enoxaparin (LOVENOX) injection 40 mg Start: 09/13/23 0800 SCDs Start: 09/12/23 1812  Diet:     Diet   Diet Heart Room service appropriate? Yes; Fluid consistency: Thin   Consultants: Ortho  Subjective 09/13/23    Pt seen with wife at bedside.  Reports minimal to no pain.  Had sweats overnight, but no more fevers documented.  Otherwise feeling well, improving.     Objective   Vitals:   09/12/23 1826 09/12/23 2023 09/12/23 2331 09/13/23 0831  BP: 118/74  (!) 90/56 112/74  Pulse: 73  62 (!) 59  Resp: 19  20 18   Temp: 98.5 F (36.9 C) 98.1 F (36.7 C) 97.6 F (36.4 C) 98.3 F (36.8 C)  TempSrc:  Oral    SpO2: 94%  95% 97%  Weight:      Height:         Intake/Output Summary (Last 24 hours) at 09/13/2023 1104 Last data filed at 09/13/2023 0900 Gross per 24 hour  Intake 949.07 ml  Output 65 ml  Net 884.07 ml   Filed Weights   09/10/23 1346 09/12/23 1509  Weight: 117.9 kg 117.9 kg    Physical Exam:  General exam: awake, alert, no acute distress HEENT: atraumatic, clear conjunctiva, anicteric sclera, moist mucus membranes, hearing grossly normal  Respiratory system: CTA, no wheezes, rales or rhonchi, normal respiratory effort. Cardiovascular system: normal S1/S2, RRR, no JVD, murmurs, rubs, gallops, no pedal edema.   Gastrointestinal system: soft, NT, ND, no HSM felt, +bowel sounds. Central nervous system: A&O x 4. no gross focal neurologic deficits, normal speech Extremities: R elbow no warmth erythema or swelling, L knee with clean dry intact dressing in place with distal warmth and sensation intact Skin: dry, intact, normal temperature Psychiatry: normal mood, congruent affect, judgement and insight appear normal   Labs   I have personally reviewed the following labs and imaging studies CBC    Component Value Date/Time   WBC 8.3 09/13/2023 0155   RBC 5.06 09/13/2023 0155   HGB 15.0 09/13/2023 0155   HGB 14.1 11/18/2013 0435   HCT 44.3 09/13/2023 0155   HCT 40.7 11/18/2013 0435   PLT 226 09/13/2023 0155   PLT 143 (L) 11/18/2013 0435   MCV 87.5 09/13/2023 0155   MCV 87 11/18/2013 0435   MCH 29.6 09/13/2023 0155   MCHC 33.9 09/13/2023 0155   RDW 13.3 09/13/2023 0155   RDW 14.6 (H) 11/18/2013 0435   LYMPHSABS 1.6 09/11/2023 0506   LYMPHSABS 1.8 11/18/2013 0435   MONOABS 0.8 09/11/2023 0506   MONOABS 0.4 11/18/2013 0435   EOSABS 0.2 09/11/2023 0506   EOSABS 0.2 11/18/2013 0435   BASOSABS 0.1 09/11/2023 0506   BASOSABS 0.1 11/18/2013 0435      Latest Ref Rng & Units 09/13/2023    1:55 AM 09/12/2023    5:20 AM 09/11/2023    5:06 AM  BMP  Glucose 70 - 99 mg/dL 161   096   BUN 6 - 20 mg/dL 18   12   Creatinine  0.45 - 1.24 mg/dL 4.09  8.11  9.14   Sodium 135 - 145 mmol/L 132   136   Potassium 3.5 - 5.1 mmol/L 4.9   4.0   Chloride 98 - 111 mmol/L 99   101   CO2 22 - 32 mmol/L 25   27   Calcium 8.9 - 10.3 mg/dL 8.6   8.5     DG Knee 1-2 Views Left  Result Date: 09/11/2023 CLINICAL DATA:  Knee pain.  No known injury. EXAM: LEFT KNEE - 1-2 VIEW COMPARISON:  None Available. FINDINGS: There is no acute fracture or dislocation. There is arthritic changes of the knee with moderate narrowing of  the lateral compartment. Small joint effusion. The soft tissues are unremarkable. IMPRESSION: 1. No acute fracture or dislocation. 2. Arthritic changes of the knee with moderate narrowing of the lateral compartment. Electronically Signed   By: Elgie Collard M.D.   On: 09/11/2023 21:51    Disposition Plan & Communication  Patient status: Inpatient  Admitted From: Home Planned disposition location: Home Anticipated discharge date: 11/9 pending culture results and clinical improvement for transition to PO Abx course  Family Communication: wife at bedside    Author: Pennie Banter, DO [facility] 09/13/2023, 11:04 AM   Available by Epic secure chat 7AM-7PM. If 7PM-7AM, please contact night-coverage.  [facility] contact information found on [email or web address].

## 2023-09-13 NOTE — Evaluation (Signed)
Physical Therapy Evaluation Patient Details Name: Harry Peters MRN: [id number] DOB: 1960/12/30 Today's Date: 09/13/2023  History of Present Illness  Pt is a 62 yo male presenting to the ED on 11/5 with complaints of increased elbow joint swelling with fever that has symptomatically improved with antibiotics. On 11/7, he woke up with significant bilateral knee pain and mild effusion on the left and underwent an arthroscopic irrigation and debridement of L knee on the same day. PMH includes gout, OSA not on CPAP, hyperlipidemia, sesamoidectomy in 2016, and a cardiac catheterization in 2015.   Clinical Impression  Pt alert and oriented x 4 t/o session; reporting 0/10 L knee pain upon entry (increased to 2/10 with mobility). Pt lives at home with his wife and children, 4 STE (B/L handrails), able to live on the first floor, and was independent with all mobility and ADL/IADLs prior to recent hospitalization. During session, pt required supervision for sup > sit and CGA for STS and amb with RW. See details below for gait impairments, but pt mainly limited by decreased range of motion, strength, balance, and endurance deficits (HR 72, O2 95% at end of session sitting in recliner). Educated pt on proper use of RW to minimize R elbow swelling, WBing precautions, and importance of avoiding overexertion moving forward. Pt would benefit from continued acute PT to maximize functional independence and decrease caregiver burden.       If plan is discharge home, recommend the following: A little help with walking and/or transfers;A little help with bathing/dressing/bathroom;Help with stairs or ramp for entrance;Assist for transportation   Can travel by private vehicle    yes    Equipment Recommendations Rolling walker (2 wheels)  Recommendations for Other Services       Functional Status Assessment Patient has had a recent decline in their functional status and demonstrates the ability to make  significant improvements in function in a reasonable and predictable amount of time.     Precautions / Restrictions Precautions Precautions: Fall Restrictions Weight Bearing Restrictions: Yes LLE Weight Bearing: Weight bearing as tolerated      Mobility  Bed Mobility Overal bed mobility: Needs Assistance Bed Mobility: Supine to Sit     Supine to sit: Supervision          Transfers Overall transfer level: Needs assistance Equipment used: Rolling walker (2 wheels) Transfers: Sit to/from Stand Sit to Stand: Min assist, Contact guard assist           General transfer comment: minA and unsteady with 1st attempt 2/2 not being close enough to EOB; CGA for 2nd attempt and increased steadiness    Ambulation/Gait Ambulation/Gait assistance: Contact guard assist Gait Distance (Feet): 160 Feet Assistive device: Rolling walker (2 wheels) Gait Pattern/deviations: Step-to pattern, Antalgic, Decreased weight shift to left, Decreased stance time - left Gait velocity: decreased     General Gait Details: decreased L knee flexion; slight circumduction noted; educated pt on importance of pacing himself and minimizing weight through RUE 2/2 hx of elbow swelling  Stairs            Wheelchair Mobility     Tilt Bed    Modified Rankin (Stroke Patients Only)       Balance Overall balance assessment: Needs assistance Sitting-balance support: No upper extremity supported, Feet supported Sitting balance-Leahy Scale: Normal     Standing balance support: Reliant on assistive device for balance, Bilateral upper extremity supported Standing balance-Leahy Scale: Fair  Pertinent Vitals/Pain Pain Assessment Pain Assessment: 0-10 Pain Score: 2  Pain Location: L knee: 0/10 pain upon entry; 2/10 at end of session Pain Descriptors / Indicators: Discomfort Pain Intervention(s): Monitored during session, Limited activity within patient's  tolerance, Repositioned    Home Living Family/patient expects to be discharged to:: Private residence Living Arrangements: Spouse/significant other;Children Available Help at Discharge: Family;Available 24 hours/day Type of Home: House Home Access: Stairs to enter Entrance Stairs-Rails: Can reach both;Left;Right Entrance Stairs-Number of Steps: 4 Alternate Level Stairs-Number of Steps: flight- does not need to use Home Layout: Two level;Able to live on main level with bedroom/bathroom Home Equipment: Gilmer Mor - single point      Prior Function Prior Level of Function : Independent/Modified Independent;Working/employed;Driving             Mobility Comments: independent with all mobility ADLs Comments: independent with all ADLs     Extremity/Trunk Assessment   Upper Extremity Assessment Upper Extremity Assessment: RUE deficits/detail RUE Deficits / Details: noted R elbow swelling; limited extension 2/2 tightness (~10 degrees short of neutral); full active flexion    Lower Extremity Assessment Lower Extremity Assessment: LLE deficits/detail;Overall WFL for tasks assessed LLE Deficits / Details: full active hip flexion, ankle DF; at least 90 degrees knee flexion in sitting    Cervical / Trunk Assessment Cervical / Trunk Assessment: Normal  Communication   Communication Communication: No apparent difficulties Cueing Techniques: Verbal cues  Cognition Arousal: Alert Behavior During Therapy: WFL for tasks assessed/performed Overall Cognitive Status: Within Functional Limits for tasks assessed                                          General Comments General comments (skin integrity, edema, etc.): L knee hemovac; R elbow swelling    Exercises     Assessment/Plan    PT Assessment Patient needs continued PT services  PT Problem List Decreased strength;Decreased mobility;Decreased range of motion;Decreased activity tolerance;Decreased balance;Decreased  knowledge of use of DME       PT Treatment Interventions DME instruction;Therapeutic exercise;Gait training;Balance training;Stair training;Neuromuscular re-education;Functional mobility training;Therapeutic activities;Patient/family education    PT Goals (Current goals can be found in the Care Plan section)  Acute Rehab PT Goals Patient Stated Goal: to get stronger PT Goal Formulation: With patient Time For Goal Achievement: 09/27/23 Potential to Achieve Goals: Good    Frequency Min 1X/week     Co-evaluation               AM-PAC PT "6 Clicks" Mobility  Outcome Measure Help needed turning from your back to your side while in a flat bed without using bedrails?: None Help needed moving from lying on your back to sitting on the side of a flat bed without using bedrails?: None Help needed moving to and from a bed to a chair (including a wheelchair)?: A Little Help needed standing up from a chair using your arms (e.g., wheelchair or bedside chair)?: A Little Help needed to walk in hospital room?: A Little Help needed climbing 3-5 steps with a railing? : A Little 6 Click Score: 20    End of Session Equipment Utilized During Treatment: Gait belt Activity Tolerance: Patient tolerated treatment well Patient left: with chair alarm set;with call bell/phone within reach;in chair Nurse Communication: Mobility status;Precautions;Weight bearing status PT Visit Diagnosis: Unsteadiness on feet (R26.81);Other abnormalities of gait and mobility (R26.89);Muscle weakness (generalized) (M62.81);Pain;Difficulty in walking,  not elsewhere classified (R26.2) Pain - Right/Left: Left Pain - part of body: Knee    Time: 4098-1191 PT Time Calculation (min) (ACUTE ONLY): 37 min   Charges:   PT Evaluation $PT Eval Low Complexity: 1 Low PT Treatments $Gait Training: 8-22 mins $Therapeutic Activity: 8-22 mins PT General Charges $$ ACUTE PT VISIT: 1 Visit           Shauna Hugh,  SPT 09/13/2023, 12:58 PM

## 2023-09-13 NOTE — Progress Notes (Signed)
  Subjective: 1 Day Post-Op Procedure(s) (LRB): LEFT KNEE ARTHROSCOPY WASH OUT (Left) Patient reports pain as mild.   Patient is  well, he states that his left knee is feeling much better this morning.  Reports elbow is also improving at this time. Plan is to go Home after hospital stay. Negative for chest pain and shortness of breath Fever: no Gastrointestinal:Negative for nausea and vomiting  Objective: Vital signs in last 24 hours: Temp:  [97.6 F (36.4 C)-102.6 F (39.2 C)] 97.6 F (36.4 C) (11/07 2331) Pulse Rate:  [62-88] 62 (11/07 2331) Resp:  [13-20] 20 (11/07 2331) BP: (90-129)/(56-77) 90/56 (11/07 2331) SpO2:  [91 %-95 %] 95 % (11/07 2331) Weight:  [117.9 kg] 117.9 kg (11/07 1509)  Intake/Output from previous day:  Intake/Output Summary (Last 24 hours) at 09/13/2023 0736 Last data filed at 09/13/2023 0600 Gross per 24 hour  Intake 829.07 ml  Output 665 ml  Net 164.07 ml    Intake/Output this shift: No intake/output data recorded.  Labs: Recent Labs    09/10/23 1349 09/11/23 0506 09/12/23 0520 09/13/23 0155  HGB 15.6 14.4 13.9 15.0   Recent Labs    09/12/23 0520 09/13/23 0155  WBC 9.4 8.3  RBC 4.59 5.06  HCT 40.7 44.3  PLT 198 226   Recent Labs    09/11/23 0506 09/12/23 0520 09/13/23 0155  NA 136  --  132*  K 4.0  --  4.9  CL 101  --  99  CO2 27  --  25  BUN 12  --  18  CREATININE 1.14 1.19 1.24  GLUCOSE 160*  --  230*  CALCIUM 8.5*  --  8.6*   No results for input(s): "LABPT", "INR" in the last 72 hours.   EXAM General - Patient is Alert, Appropriate, and Oriented Extremity - ABD soft Neurovascular intact Dorsiflexion/Plantar flexion intact Incision: dressing C/D/I No cellulitis present Dressing/Incision - clean, dry, no drainage noted to the left knee ACE wrap.  Hemovac intact without significant drainage. Motor Function - intact, moving foot and toes well on exam.  Abdomen soft with intact bowel sounds.  Past Medical History:   Diagnosis Date   Motion sickness    Rough seas   Wears dentures    full upper, partial lower   Assessment/Plan: 1 Day Post-Op Procedure(s) (LRB): LEFT KNEE ARTHROSCOPY WASH OUT (Left) Principal Problem:   Septic bursitis Active Problems:   Sleep apnea   Gout   Septic bursitis of elbow   Septic bursitis of elbow, right   Pyogenic arthritis of multiple sites (HCC)  Estimated body mass index is 33.38 kg/m as calculated from the following:   Height as of this encounter: 6\' 2"  (1.88 m).   Weight as of this encounter: 117.9 kg. Advance diet Up with therapy D/C IV fluids when tolerating po intake.  Labs reviewed this AM, WBC 8.3 this morning. Cultures from Sx pending at this time.  No ogranisms seen on gram stain, however patient has been on Abx for several days. Patient reports that his knee pain is much improved this morning. Continue on Clindamycin at this time. Plan to removed drain tomorrow. Can possibly d/c home tomorrow pending continue culture and close follow-up  DVT Prophylaxis - Lovenox and TED hose Weight-Bearing as tolerated to left leg  J. Horris Latino, PA-C [facility] 09/13/2023, 7:36 AM

## 2023-09-13 NOTE — Plan of Care (Signed)
  Problem: Education: Goal: Knowledge of General Education information will improve Description: Including pain rating scale, medication(s)/side effects and non-pharmacologic comfort measures Outcome: Progressing   Problem: Health Behavior/Discharge Planning: Goal: Ability to manage health-related needs will improve Outcome: Progressing   Problem: Clinical Measurements: Goal: Ability to maintain clinical measurements within normal limits will improve Outcome: Progressing Goal: Will remain free from infection Outcome: Progressing Goal: Diagnostic test results will improve Outcome: Progressing Goal: Respiratory complications will improve Outcome: Progressing Goal: Cardiovascular complication will be avoided Outcome: Progressing   Problem: Activity: Goal: Risk for activity intolerance will decrease Outcome: Progressing   Problem: Nutrition: Goal: Adequate nutrition will be maintained Outcome: Progressing   Problem: Coping: Goal: Level of anxiety will decrease Outcome: Progressing   Problem: Pain Management: Goal: General experience of comfort will improve Outcome: Progressing   Problem: Safety: Goal: Ability to remain free from injury will improve Outcome: Progressing   Problem: Education: Goal: Knowledge of General Education information will improve Description: Including pain rating scale, medication(s)/side effects and non-pharmacologic comfort measures Outcome: Progressing   Problem: Health Behavior/Discharge Planning: Goal: Ability to manage health-related needs will improve Outcome: Progressing   Problem: Clinical Measurements: Goal: Ability to maintain clinical measurements within normal limits will improve Outcome: Progressing Goal: Will remain free from infection Outcome: Progressing Goal: Diagnostic test results will improve Outcome: Progressing Goal: Respiratory complications will improve Outcome: Progressing Goal: Cardiovascular complication will be  avoided Outcome: Progressing   Problem: Activity: Goal: Risk for activity intolerance will decrease Outcome: Progressing   Problem: Elimination: Goal: Will not experience complications related to bowel motility Outcome: Progressing Goal: Will not experience complications related to urinary retention Outcome: Progressing   Problem: Pain Management: Goal: General experience of comfort will improve Outcome: Progressing

## 2023-09-14 DIAGNOSIS — M7110 Other infective bursitis, unspecified site: Secondary | ICD-10-CM | POA: Diagnosis not present

## 2023-09-14 LAB — BASIC METABOLIC PANEL
Anion gap: 10 (ref 5–15)
BUN: 30 mg/dL — ABNORMAL HIGH (ref 6–20)
CO2: 24 mmol/L (ref 22–32)
Calcium: 8.2 mg/dL — ABNORMAL LOW (ref 8.9–10.3)
Chloride: 102 mmol/L (ref 98–111)
Creatinine, Ser: 1.21 mg/dL (ref 0.61–1.24)
GFR, Estimated: >60 mL/min (ref >60)
Glucose, Bld: 154 mg/dL — ABNORMAL HIGH (ref 70–99)
Potassium: 3.5 mmol/L (ref 3.5–5.1)
Sodium: 136 mmol/L (ref 135–145)

## 2023-09-14 LAB — CBC
HCT: 40.9 % (ref 39.0–52.0)
Hemoglobin: 13.9 g/dL (ref 13.0–17.0)
MCH: 30.3 pg (ref 26.0–34.0)
MCHC: 34 g/dL (ref 30.0–36.0)
MCV: 89.1 fL (ref 80.0–100.0)
Platelets: 247 10*3/uL (ref 150–400)
RBC: 4.59 MIL/uL (ref 4.22–5.81)
RDW: 13.3 % (ref 11.5–15.5)
WBC: 6.4 10*3/uL (ref 4.0–10.5)
nRBC: 0 % (ref 0.0–0.2)

## 2023-09-14 MED ORDER — CEPHALEXIN 500 MG PO CAPS
500.0000 mg | ORAL_CAPSULE | Freq: Two times a day (BID) | ORAL | Status: DC
  Administered 2023-09-14 – 2023-09-15 (×3): 500 mg via ORAL
  Filled 2023-09-14 (×3): qty 1

## 2023-09-14 MED ORDER — DOXYCYCLINE HYCLATE 100 MG PO TABS
100.0000 mg | ORAL_TABLET | Freq: Two times a day (BID) | ORAL | Status: DC
  Administered 2023-09-14 – 2023-09-15 (×3): 100 mg via ORAL
  Filled 2023-09-14 (×3): qty 1

## 2023-09-14 NOTE — Progress Notes (Signed)
PROGRESS NOTE  Harry SHAULIS    DOB: 11-12-1960, 62 y.o.  UVO:[id number]    Code Status: Full Code   DOA: 09/10/2023   LOS: 3   Brief hospital course  Harry Peters is a 62 y.o. male with medical history significant of OSA not on CPAP, hyperlipidemia, gout.  They presented to the ED due to R elbow swelling worsening x 2 days.  He endorses fevers. He denies any known trauma to the right elbow.  He denies any recent scratches from any animals or prolonged outdoor exposure.  No recent dental work   ED course: normotensive at 121/77 with heart rate of 85. saturating at 98% on room air.  He was afebrile at 99.5. Initial workup demonstrated WBC of 11.6, glucose of 110, creatinine 1.06 with GFR above 60.  Lactic acid negative. ESR elevated at 28.   Right elbow x-ray nothing acute R elbow CT: Marked subcutaneous edema throughout the dorsum of the elbow and proximal forearm. No fluid collection or abscess. 2. No evidence of acute fracture or osteomyelitis. 3. Mild osteoarthritis.  Orthopedic surgery consulted.  Patient was admitted to medicine service for further workup and management of Probable septic olecranon bursitis right elbow as outlined in detail below.  They were initially treated with vancomycin and ciprofloxacin. Transitioned to clindamycin 11/6 and had significant improvement.  09/14/23 - woke up with significant bilateral knee pain and mild effusion on the left. Usual location of gout is foot. Has been on colchicine since yesterday. Unable to bear weight this am from pain. Ortho aspirated and appears like gout attack.   Assessment & Plan  Principal Problem:   Septic bursitis Active Problems:   Sleep apnea   Gout   Septic bursitis of elbow   Septic bursitis of elbow, right   Pyogenic arthritis of multiple sites (HCC)  Septic olecranon bursitis R elbow- pain and swelling much improved. Ortho has evaluated.  - Treated with IV clindamycin (due to penicillin  allergy) - Transition to PO doxycycline and Keflex x 14 days -- ortho PA recommended empiric 14 days PO abx at d/c (also empiric for knee) - analgesia PRN - mobilize 11/8>>9 - no erythema or warmth, swelling appears resolved, improved ROM  Bilateral knee pain / Suspect Gout flare Onset was mild on 11/6 afternoon and significantly worsened by AM 11/7, pt unable to bear weight or ambulate.  L knee xray 11/6 without abnormality.  Gout was suspected but no crystals seen on aspiration collected 11/7. Synovial fluid showing WBC highly elevated to 23,618. ESR and CRP were elevated on admission but non-specific.   Blood cultures remain negative x 3 days.   Knee synovial fluid culture no organisms, but was collected after on antibiotics. CK normal Septic arthritis? - needs to be ruled out.    --Culture synovial fluid pending - follow - Transition IV clindamycin >> PO doxy and Keflex x 14 days - Ortho is following -- took pt to OR on 11/7 for washout - Ortho removed drain/s this AM 11/9 - PT/OT - rec [facility] for [facility] PT - Ambulate as tolerated - Pain control PRN, bowel regimen - Ortho will see this week in clinic for follow up   Gout- mild uric acid increase on admission Previous history of gout noted, not on chronic management.  - antiinflammatory trial with scheduled Toradol - avoiding steroids due to infection with septic elbow  Pre-diabetes / Hyperglycemia Last Hbg A1c Oct 2023 was 5.7% Repeat A1c 5.8% Note fasting glucose on  BMP  230 (11/8) --Hold of sliding scale insulin for now --Continue to monitor fasting glucose on labs --PCP follow up    Sleep apnea No recent use of CPAP noted.   Body mass index is 33.38 kg/m.  VTE ppx: enoxaparin (LOVENOX) injection 40 mg Start: 09/13/23 0800 SCDs Start: 09/12/23 1812  Diet:     Diet   Diet Heart Room service appropriate? Yes; Fluid consistency: Thin   Consultants: Ortho   Subjective 09/14/23    Pt seen with wife at bedside.  Ortho  removed drain this AM.  He is tolerating ambulation fairly well, asking to work with PT or walk laps on his own.  No sweats overnight or chills, no further fevers documented.      Objective   Vitals:   09/13/23 0831 09/13/23 1528 09/13/23 2312 09/14/23 0757  BP: 112/74 109/74 123/86 114/83  Pulse: (!) 59 63 63 (!) 59  Resp: 18 16 18 17   Temp: 98.3 F (36.8 C) 97.7 F (36.5 C) 97.9 F (36.6 C) (!) 97.5 F (36.4 C)  TempSrc:      SpO2: 97% 99% 97% 99%  Weight:      Height:        Intake/Output Summary (Last 24 hours) at 09/14/2023 1137 Last data filed at 09/13/2023 1912 Gross per 24 hour  Intake 240 ml  Output 30 ml  Net 210 ml   Filed Weights   09/10/23 1346 09/12/23 1509  Weight: 117.9 kg 117.9 kg    Physical Exam:  General exam: awake, alert, no acute distress HEENT: moist mucus membranes, hearing grossly normal  Respiratory system: on  room air, normal respiratory effort. Cardiovascular system: RRR, no pedal edema.   Central nervous system: A&O x 4. no gross focal neurologic deficits, normal speech Extremities: R elbow no warmth erythema or swelling, L knee with clean dry intact dressing in place Skin: dry, intact, normal temperature Psychiatry: normal mood, congruent affect, judgement and insight appear normal   Labs   I have personally reviewed the following labs and imaging studies CBC    Component Value Date/Time   WBC 6.4 09/14/2023 0644   RBC 4.59 09/14/2023 0644   HGB 13.9 09/14/2023 0644   HGB 14.1 11/18/2013 0435   HCT 40.9 09/14/2023 0644   HCT 40.7 11/18/2013 0435   PLT 247 09/14/2023 0644   PLT 143 (L) 11/18/2013 0435   MCV 89.1 09/14/2023 0644   MCV 87 11/18/2013 0435   MCH 30.3 09/14/2023 0644   MCHC 34.0 09/14/2023 0644   RDW 13.3 09/14/2023 0644   RDW 14.6 (H) 11/18/2013 0435   LYMPHSABS 1.6 09/11/2023 0506   LYMPHSABS 1.8 11/18/2013 0435   MONOABS 0.8 09/11/2023 0506   MONOABS 0.4 11/18/2013 0435   EOSABS 0.2 09/11/2023 0506   EOSABS  0.2 11/18/2013 0435   BASOSABS 0.1 09/11/2023 0506   BASOSABS 0.1 11/18/2013 0435      Latest Ref Rng & Units 09/14/2023    6:44 AM 09/13/2023    1:55 AM 09/12/2023    5:20 AM  BMP  Glucose 70 - 99 mg/dL 272  536    BUN 6 - 20 mg/dL 30  18    Creatinine 6.44 - 1.24 mg/dL 0.34  7.42  5.95   Sodium 135 - 145 mmol/L 136  132    Potassium 3.5 - 5.1 mmol/L 3.5  4.9    Chloride 98 - 111 mmol/L 102  99    CO2 22 - 32 mmol/L 24  25    Calcium 8.9 - 10.3 mg/dL 8.2  8.6      No results found.  Disposition Plan & Communication  Patient status: Inpatient  Admitted From: Home Planned disposition location: Home Anticipated discharge date: 11/10 pending culture results and further clinical improvement for transition to PO Abx course  Family Communication: wife at bedside    Author: Pennie Banter, DO [facility] 09/14/2023, 11:37 AM   Available by Epic secure chat 7AM-7PM. If 7PM-7AM, please contact night-coverage.  [facility] contact information found on [email or web address].

## 2023-09-14 NOTE — Progress Notes (Addendum)
Subjective: 2 Days Post-Op Procedure(s) (LRB): LEFT KNEE ARTHROSCOPY WASH OUT (Left) Patient reports pain as mild.   Patient is  well, he states that his left knee is feeling good this morning.  Reports elbow is also improving at this time, reports some slight achiness around the olecranon. Plan is to go Home after hospital stay. Negative for chest pain and shortness of breath Fever: no Gastrointestinal:Negative for nausea and vomiting  Objective: Vital signs in last 24 hours: Temp:  [97.5 F (36.4 C)-97.9 F (36.6 C)] 97.5 F (36.4 C) (11/09 0757) Pulse Rate:  [59-63] 59 (11/09 0757) Resp:  [16-18] 17 (11/09 0757) BP: (109-123)/(74-86) 114/83 (11/09 0757) SpO2:  [97 %-99 %] 99 % (11/09 0757)  Intake/Output from previous day:  Intake/Output Summary (Last 24 hours) at 09/14/2023 1000 Last data filed at 09/13/2023 1912 Gross per 24 hour  Intake 240 ml  Output 30 ml  Net 210 ml    Intake/Output this shift: No intake/output data recorded.  Labs: Recent Labs    09/12/23 0520 09/13/23 0155 09/14/23 0644  HGB 13.9 15.0 13.9   Recent Labs    09/13/23 0155 09/14/23 0644  WBC 8.3 6.4  RBC 5.06 4.59  HCT 44.3 40.9  PLT 226 247   Recent Labs    09/13/23 0155 09/14/23 0644  NA 132* 136  K 4.9 3.5  CL 99 102  CO2 25 24  BUN 18 30*  CREATININE 1.24 1.21  GLUCOSE 230* 154*  CALCIUM 8.6* 8.2*   No results for input(s): "LABPT", "INR" in the last 72 hours.   EXAM General - Patient is Alert, Appropriate, and Oriented Extremity - ABD soft Neurovascular intact Dorsiflexion/Plantar flexion intact Incision: dressing C/D/I No cellulitis present Dressing/Incision - clean, dry, no drainage noted to the left knee ACE wrap.  His JP drain was removed without any difficulty, intact.  A new dressing was applied utilizing Xeroform, gauze and Ace wrap. Motor Function - intact, moving foot and toes well on exam.  Able to flex and extend at his right elbow with good strength  and minimal pain.  Does have difficulty getting to full extension at this time, but reports that it is improved over the past few days. Abdomen soft with intact bowel sounds.  Past Medical History:  Diagnosis Date   Motion sickness    Rough seas   Wears dentures    full upper, partial lower   Assessment/Plan: 2 Days Post-Op Procedure(s) (LRB): LEFT KNEE ARTHROSCOPY WASH OUT (Left) Principal Problem:   Septic bursitis Active Problems:   Sleep apnea   Gout   Septic bursitis of elbow   Septic bursitis of elbow, right   Pyogenic arthritis of multiple sites (HCC)  Estimated body mass index is 33.38 kg/m as calculated from the following:   Height as of this encounter: 6\' 2"  (1.88 m).   Weight as of this encounter: 117.9 kg. Advance diet Up with therapy D/C IV fluids when tolerating po intake.  Labs reviewed this AM, WBC 6.4 this morning. Cultures from Sx pending at this time.  No ogranisms seen on gram stain, however patient has been on Abx for several days. Patient reports that his knee pain is much improved this morning. Continue on Clindamycin at this time. JP drain was removed today without any difficulty, intact Can possibly d/c home pending culture and close follow-up.  Plan to follow up with [facility] orthopedics in 5 days for suture removal and wound check  DVT Prophylaxis - Lovenox and TED  hose Weight-Bearing as tolerated to left leg  Danise Edge, PA-C [facility] 09/14/2023, 10:00 AM

## 2023-09-14 NOTE — Progress Notes (Signed)
Patient Peripheral IV pulled out by accident. Catheter intact upon inspection; wants to keep IV out per pt request. MD notified.

## 2023-09-14 NOTE — Plan of Care (Signed)
  Problem: Education: Goal: Knowledge of General Education information will improve Description: Including pain rating scale, medication(s)/side effects and non-pharmacologic comfort measures Outcome: Progressing   Problem: Health Behavior/Discharge Planning: Goal: Ability to manage health-related needs will improve Outcome: Progressing   Problem: Activity: Goal: Risk for activity intolerance will decrease Outcome: Progressing   Problem: Nutrition: Goal: Adequate nutrition will be maintained Outcome: Progressing   Problem: Pain Management: Goal: General experience of comfort will improve Outcome: Progressing

## 2023-09-14 NOTE — Plan of Care (Signed)

## 2023-09-15 DIAGNOSIS — M71121 Other infective bursitis, right elbow: Secondary | ICD-10-CM | POA: Diagnosis not present

## 2023-09-15 DIAGNOSIS — M7110 Other infective bursitis, unspecified site: Secondary | ICD-10-CM | POA: Diagnosis not present

## 2023-09-15 DIAGNOSIS — M0089 Polyarthritis due to other bacteria: Secondary | ICD-10-CM | POA: Diagnosis not present

## 2023-09-15 LAB — CULTURE, BLOOD (ROUTINE X 2)
Culture: NO GROWTH
Special Requests: ADEQUATE

## 2023-09-15 LAB — BODY FLUID CULTURE W GRAM STAIN
Culture: NO GROWTH
Special Requests: NORMAL

## 2023-09-15 MED ORDER — ACETAMINOPHEN 325 MG PO TABS: 325.0000 mg | ORAL_TABLET | Freq: Four times a day (QID) | ORAL | Status: AC | PRN

## 2023-09-15 MED ORDER — HYDROXYZINE HCL 10 MG PO TABS
10.0000 mg | ORAL_TABLET | Freq: Three times a day (TID) | ORAL | Status: DC | PRN
  Administered 2023-09-15: 10 mg via ORAL
  Filled 2023-09-15 (×2): qty 1

## 2023-09-15 MED ORDER — COLCHICINE 0.6 MG PO TABS: 0.6000 mg | ORAL_TABLET | Freq: Every day | ORAL | 0 refills | Status: AC

## 2023-09-15 MED ORDER — DOXYCYCLINE HYCLATE 100 MG PO TABS: 100.0000 mg | ORAL_TABLET | Freq: Two times a day (BID) | ORAL | 0 refills | Status: AC

## 2023-09-15 MED ORDER — CEPHALEXIN 500 MG PO CAPS: 500.0000 mg | ORAL_CAPSULE | Freq: Two times a day (BID) | ORAL | 0 refills | Status: AC

## 2023-09-15 NOTE — Discharge Instructions (Addendum)
Continue your antibiotics until complete as well as the colchicine for inflammation.  Follow up with ortho as directed.  Return to the hospital if you notice worsening redness, pain, or swelling of your joints.

## 2023-09-15 NOTE — Plan of Care (Signed)

## 2023-09-15 NOTE — Discharge Summary (Incomplete)
Physician Discharge Summary  Patient: Harry Peters WUJ:[id number] DOB: [birth year]   Code Status: Full Code Admit date: 09/10/2023 Discharge date: 09/15/2023 Disposition: Home, No home health services recommended PCP: Jerl Mina, MD  Recommendations for Outpatient Follow-up:  Follow up with PCP within 1-2 weeks Regarding general hospital follow up and preventative care Recommend    Discharge Diagnoses:  Principal Problem:   Septic bursitis Active Problems:   Sleep apnea   Gout   Septic bursitis of elbow   Septic bursitis of elbow, right   Pyogenic arthritis of multiple sites [facility])  Brief Hospital Course Summary: Pt ***  Discharge Condition: {DISCHARGE CONDITION:19696}, improved Recommended discharge diet: {Discharge NFAO:[id number]}  Consultations: ***  Procedures/Studies: ***   Allergies as of 09/15/2023       Reactions   Penicillins Hives, Shortness Of Breath, Swelling, Other (See Comments)   Patient had the reaction as a child and is not able to answer the follow up questions        Medication List     STOP taking these medications    ondansetron 4 MG disintegrating tablet Commonly known as: Zofran ODT   oxyCODONE 5 MG immediate release tablet Commonly known as: Roxicodone   oxyCODONE-acetaminophen 5-325 MG tablet Commonly known as: Percocet   tamsulosin 0.4 MG Caps capsule Commonly known as: Flomax   triamcinolone cream 0.1 % Commonly known as: KENALOG       TAKE these medications    acetaminophen 325 MG tablet Commonly known as: TYLENOL Take 1-2 tablets (325-650 mg total) by mouth every 6 (six) hours as needed for mild pain (pain score 1-3) (pain score 1-3 or temp > 100.5).   cephALEXin 500 MG capsule Commonly known as: KEFLEX Take 1 capsule (500 mg total) by mouth every 12 (twelve) hours for 7 days.   colchicine 0.6 MG tablet Take 1 tablet (0.6 mg total) by mouth daily. Start taking on: September 16, 2023    doxycycline 100 MG tablet Commonly known as: VIBRA-TABS Take 1 tablet (100 mg total) by mouth every 12 (twelve) hours for 7 days.        Follow-up Information     [facility] Go, [place]. Go in 5 day(s).   Specialty: Orthopedic Surgery Contact information: [address] [place] [address] [phone]                 Subjective   Pt reports ***  All questions and concerns were addressed at time of discharge.  Objective  Blood pressure 125/81, pulse 63, temperature 97.9 F (36.6 C), resp. rate 18, height 6\' 2"  (1.88 m), weight 117.9 kg, SpO2 100%.   General: Pt is alert, awake, not in acute distress Cardiovascular: RRR, S1/S2 +, no rubs, no gallops Respiratory: CTA bilaterally, no wheezing, no rhonchi Abdominal: Soft, NT, ND, bowel sounds + Extremities: no edema, no cyanosis  The results of significant diagnostics from this hospitalization (including imaging, microbiology, ancillary and laboratory) are listed below for reference.   Imaging studies: DG Knee 1-2 Views Left  Result Date: 09/11/2023 CLINICAL DATA:  Knee pain.  No known injury. EXAM: LEFT KNEE - 1-2 VIEW COMPARISON:  None Available. FINDINGS: There is no acute fracture or dislocation. There is arthritic changes of the knee with moderate narrowing of the lateral compartment. Small joint effusion. The soft tissues are unremarkable. IMPRESSION: 1. No acute fracture or dislocation. 2. Arthritic changes of the knee with moderate narrowing of the lateral compartment. Electronically Signed   By: Burtis Junes  Radparvar M.D.   On: 09/11/2023 21:51   CT EBLOW RIGHT W CONTRAST  Result Date: 09/10/2023 CLINICAL DATA:  Right elbow swelling for 2 weeks, worsening pain and erythema EXAM: CT OF THE UPPER RIGHT EXTREMITY WITH CONTRAST TECHNIQUE: Multidetector CT imaging of the upper right extremity was performed according to the standard protocol following intravenous contrast administration. RADIATION DOSE REDUCTION:  This exam was performed according to the departmental dose-optimization program which includes automated exposure control, adjustment of the mA and/or kV according to patient size and/or use of iterative reconstruction technique. CONTRAST:  OMNIPAQUE IOHEXOL 300 MG/ML  SOLN COMPARISON:  09/10/2023 FINDINGS: Bones/Joint/Cartilage There are no acute or destructive bony abnormalities. Mild osteoarthritis, with spurring along the margins of the olecranon and proximal ulna. Enthesopathic changes are seen at the olecranon. No evidence of joint effusion. Ligaments Suboptimally assessed by CT. Muscles and Tendons No acute findings. Soft tissues There is marked subcutaneous edema throughout the dorsum of the elbow and proximal forearm. No evidence of fluid collection or abscess. Vascular structures enhance normally. Reconstructed images demonstrate no additional findings. IMPRESSION: 1. Marked subcutaneous edema throughout the dorsum of the elbow and proximal forearm. No fluid collection or abscess. 2. No evidence of acute fracture or osteomyelitis. 3. Mild osteoarthritis. Electronically Signed   By: Sharlet Salina M.D.   On: 09/10/2023 20:50   DG Elbow Complete Right  Result Date: 09/10/2023 CLINICAL DATA:  Swelling and warmth.  Fever and chills. EXAM: RIGHT ELBOW - COMPLETE 3+ VIEW COMPARISON:  None Available. FINDINGS: Diffuse subcutaneous edema about the elbow. No acute fracture or dislocation. No osseous destruction. No joint effusion. IMPRESSION: No acute osseous abnormality. Diffuse subcutaneous edema may represent cellulitis. Electronically Signed   By: Jeronimo Greaves M.D.   On: 09/10/2023 18:13    Labs: Basic Metabolic Panel: Recent Labs  Lab 09/10/23 1349 09/11/23 0506 09/12/23 0520 09/13/23 0155 09/14/23 0644  NA 137 136  --  132* 136  K 3.7 4.0  --  4.9 3.5  CL 102 101  --  99 102  CO2 27 27  --  25 24  GLUCOSE 110* 160*  --  230* 154*  BUN 11 12  --  18 30*  CREATININE 1.06 1.14 1.19  1.24 1.21  CALCIUM 8.8* 8.5*  --  8.6* 8.2*   CBC: Recent Labs  Lab 09/10/23 1349 09/11/23 0506 09/12/23 0520 09/13/23 0155 09/14/23 0644  WBC 11.6* 9.7 9.4 8.3 6.4  NEUTROABS 8.4* 7.0  --   --   --   HGB 15.6 14.4 13.9 15.0 13.9  HCT 45.2 42.1 40.7 44.3 40.9  MCV 86.8 88.4 88.7 87.5 89.1  PLT 229 192 198 226 247   Microbiology: ***  Time coordinating discharge: Over 30 minutes  Leeroy Bock, MD  [facility] 09/15/2023, 10:40 AM

## 2023-09-15 NOTE — Progress Notes (Signed)
  Subjective: 3 Days Post-Op Procedure(s) (LRB): LEFT KNEE ARTHROSCOPY WASH OUT (Left) Patient reports pain as mild.   Patient is  well, he states that his left knee is feeling good this morning.  Reports elbow is also improving at this time, reports some slight achiness around the olecranon. Plan is to go Home after hospital stay. Negative for chest pain and shortness of breath Fever: no Gastrointestinal:Negative for nausea and vomiting  Objective: Vital signs in last 24 hours: Temp:  [97.8 F (36.6 C)-97.9 F (36.6 C)] 97.9 F (36.6 C) (11/10 0756) Pulse Rate:  [63-65] 63 (11/10 0756) Resp:  [16-18] 18 (11/10 0756) BP: (111-125)/(66-81) 125/81 (11/10 0756) SpO2:  [95 %-100 %] 100 % (11/10 0756)  Intake/Output from previous day: No intake or output data in the 24 hours ending 09/15/23 1052   Intake/Output this shift: No intake/output data recorded.  Labs: Recent Labs    09/13/23 0155 09/14/23 0644  HGB 15.0 13.9   Recent Labs    09/13/23 0155 09/14/23 0644  WBC 8.3 6.4  RBC 5.06 4.59  HCT 44.3 40.9  PLT 226 247   Recent Labs    09/13/23 0155 09/14/23 0644  NA 132* 136  K 4.9 3.5  CL 99 102  CO2 25 24  BUN 18 30*  CREATININE 1.24 1.21  GLUCOSE 230* 154*  CALCIUM 8.6* 8.2*   No results for input(s): "LABPT", "INR" in the last 72 hours.   EXAM General - Patient is Alert, Appropriate, and Oriented Extremity - ABD soft Neurovascular intact Dorsiflexion/Plantar flexion intact Incision: dressing C/D/I No cellulitis present Dressing/Incision - clean, dry, no drainage noted to the left knee ACE wrap or underlying guaze Motor Function - intact, moving foot and toes well on exam.  Able to flex and extend at his right elbow with good strength and minimal pain.  Does have difficulty getting to full extension at this time, but reports that it is improved over the past few days. Abdomen soft with intact bowel sounds.  Past Medical History:  Diagnosis Date    Motion sickness    Rough seas   Wears dentures    full upper, partial lower   Assessment/Plan: 3 Days Post-Op Procedure(s) (LRB): LEFT KNEE ARTHROSCOPY WASH OUT (Left) Principal Problem:   Septic bursitis Active Problems:   Sleep apnea   Gout   Septic bursitis of elbow   Septic bursitis of elbow, right   Pyogenic arthritis of multiple sites (HCC)  Estimated body mass index is 33.38 kg/m as calculated from the following:   Height as of this encounter: 6\' 2"  (1.88 m).   Weight as of this encounter: 117.9 kg. Advance diet Up with therapy D/C IV fluids when tolerating po intake.  Labs reviewed this AM, WBC 6.4 from yesterday. Cultures from Sx pending at this time.  No ogranisms seen on gram stain, however patient has been on Abx for several days. Has transitioned to PO abx, Keflex and doxycyline. Continue with D/C Patient reports that his knee pain is much improved this morning. Bandage intact, no drainage appreciated Sent a message to attending, clear to DC from ortho  Plan to follow up with [facility] orthopedics in 5 days for suture removal and wound check  Weight-Bearing as tolerated to left leg  Danise Edge, PA-C [facility] Orthopaedic Surgery 09/15/2023, 10:52 AM

## 2023-09-15 NOTE — Progress Notes (Incomplete)
PROGRESS NOTE  Harry Peters    DOB: [birth year], 62 y.o.  ACZ:[id number]    Code Status: Full Code   DOA: 09/10/2023   LOS: 4   Brief hospital course  Harry Peters is a 62 y.o. male with medical history significant of OSA not on CPAP, hyperlipidemia, gout.  They presented to the ED due to R elbow swelling worsening x 2 days.  He endorses fevers. He denies any known trauma to the right elbow.  He denies any recent scratches from any animals or prolonged outdoor exposure.  No recent dental work   ED course: normotensive at 121/77 with heart rate of 85. saturating at 98% on room air.  He was afebrile at 99.5. Initial workup demonstrated WBC of 11.6, glucose of 110, creatinine 1.06 with GFR above 60.  Lactic acid negative. ESR elevated at 28.   Right elbow x-ray nothing acute R elbow CT: Marked subcutaneous edema throughout the dorsum of the elbow and proximal forearm. No fluid collection or abscess. 2. No evidence of acute fracture or osteomyelitis. 3. Mild osteoarthritis.  Orthopedic surgery consulted.  Patient was admitted to medicine service for further workup and management of Probable septic olecranon bursitis right elbow as outlined in detail below.  They were initially treated with vancomycin and ciprofloxacin. Transitioned to clindamycin 11/6 and had significant improvement.  09/15/23 - woke up with significant bilateral knee pain and mild effusion on the left. Usual location of gout is foot. Has been on colchicine since yesterday. Unable to bear weight this am from pain. Ortho aspirated and appears like gout attack.   Assessment & Plan  Principal Problem:   Septic bursitis Active Problems:   Sleep apnea   Gout   Septic bursitis of elbow   Septic bursitis of elbow, right   Pyogenic arthritis of multiple sites (HCC)  Septic olecranon bursitis R elbow- pain and swelling much improved. Ortho has evaluated.  - Treated with IV clindamycin (due to penicillin  allergy) - Transition to PO doxycycline and Keflex x 14 days -- ortho PA recommended empiric 14 days PO abx at d/c (also empiric for knee) - analgesia PRN - mobilize 11/8>>9 - no erythema or warmth, swelling appears resolved, improved ROM  Bilateral knee pain / Suspect Gout flare Onset was mild on 11/6 afternoon and significantly worsened by AM 11/7, pt unable to bear weight or ambulate.  L knee xray 11/6 without abnormality.  Gout was suspected but no crystals seen on aspiration collected 11/7. Synovial fluid showing WBC highly elevated to 23,618. ESR and CRP were elevated on admission but non-specific.   Blood cultures remain negative x 3 days.   Knee synovial fluid culture no organisms, but was collected after on antibiotics. CK normal Septic arthritis? - needs to be ruled out.    --Culture synovial fluid pending - follow - Transition IV clindamycin >> PO doxy and Keflex x 14 days - Ortho is following -- took pt to OR on 11/7 for washout - Ortho removed drain/s this AM 11/9 - PT/OT - rec is for [facility] PT - Ambulate as tolerated - Pain control PRN, bowel regimen - Ortho will see this week in clinic for follow up   Gout- mild uric acid increase on admission Previous history of gout noted, not on chronic management.  - antiinflammatory trial with scheduled Toradol - avoiding steroids due to infection with septic elbow  Pre-diabetes / Hyperglycemia Last Hbg A1c Oct 2023 was 5.7% Repeat A1c 5.8% Note fasting glucose on  BMP  230 (11/8) --Hold of sliding scale insulin for now --Continue to monitor fasting glucose on labs --PCP follow up    Sleep apnea No recent use of CPAP noted.   Body mass index is 33.38 kg/m.  VTE ppx: enoxaparin (LOVENOX) injection 40 mg Start: 09/13/23 0800 SCDs Start: 09/12/23 1812  Diet:     Diet   Diet Heart Room service appropriate? Yes; Fluid consistency: Thin   Consultants: Ortho   Subjective 09/15/23    Pt seen with wife at bedside.  Ortho  removed drain this AM.  He is tolerating ambulation fairly well, asking to work with PT or walk laps on his own.  No sweats overnight or chills, no further fevers documented.      Objective   Vitals:   09/13/23 2312 09/14/23 0757 09/14/23 1509 09/14/23 2116  BP: 123/86 114/83 120/71 111/66  Pulse: 63 (!) 59 64 65  Resp: 18 17 16    Temp: 97.9 F (36.6 C) (!) 97.5 F (36.4 C) 97.8 F (36.6 C)   TempSrc:      SpO2: 97% 99% 99% 95%  Weight:      Height:       No intake or output data in the 24 hours ending 09/15/23 0732  Filed Weights   09/10/23 1346 09/12/23 1509  Weight: 117.9 kg 117.9 kg    Physical Exam:  General exam: awake, alert, no acute distress HEENT: moist mucus membranes, hearing grossly normal  Respiratory system: on  room air, normal respiratory effort. Cardiovascular system: RRR, no pedal edema.   Central nervous system: A&O x 4. no gross focal neurologic deficits, normal speech Extremities: R elbow no warmth erythema or swelling, L knee with clean dry intact dressing in place Skin: dry, intact, normal temperature Psychiatry: normal mood, congruent affect, judgement and insight appear normal   Labs   I have personally reviewed the following labs and imaging studies CBC    Component Value Date/Time   WBC 6.4 09/14/2023 0644   RBC 4.59 09/14/2023 0644   HGB 13.9 09/14/2023 0644   HGB 14.1 11/18/2013 0435   HCT 40.9 09/14/2023 0644   HCT 40.7 11/18/2013 0435   PLT 247 09/14/2023 0644   PLT 143 (L) 11/18/2013 0435   MCV 89.1 09/14/2023 0644   MCV 87 11/18/2013 0435   MCH 30.3 09/14/2023 0644   MCHC 34.0 09/14/2023 0644   RDW 13.3 09/14/2023 0644   RDW 14.6 (H) 11/18/2013 0435   LYMPHSABS 1.6 09/11/2023 0506   LYMPHSABS 1.8 11/18/2013 0435   MONOABS 0.8 09/11/2023 0506   MONOABS 0.4 11/18/2013 0435   EOSABS 0.2 09/11/2023 0506   EOSABS 0.2 11/18/2013 0435   BASOSABS 0.1 09/11/2023 0506   BASOSABS 0.1 11/18/2013 0435      Latest Ref Rng & Units  09/14/2023    6:44 AM 09/13/2023    1:55 AM 09/12/2023    5:20 AM  BMP  Glucose 70 - 99 mg/dL 914  782    BUN 6 - 20 mg/dL 30  18    Creatinine 9.56 - 1.24 mg/dL 2.13  0.86  5.78   Sodium 135 - 145 mmol/L 136  132    Potassium 3.5 - 5.1 mmol/L 3.5  4.9    Chloride 98 - 111 mmol/L 102  99    CO2 22 - 32 mmol/L 24  25    Calcium 8.9 - 10.3 mg/dL 8.2  8.6      No results found.  Disposition Plan &  Communication  Patient status: Inpatient  Admitted From: Home Planned disposition location: Home Anticipated discharge date: 11/10 pending culture results and further clinical improvement for transition to PO Abx course  Family Communication: wife at bedside    Author: Leeroy Bock, DO [facility] 09/15/2023, 7:32 AM   Available by Epic secure chat 7AM-7PM. If 7PM-7AM, please contact night-coverage.  [facility] contact information found on [email or web address].

## 2023-09-15 NOTE — Plan of Care (Signed)
  Problem: Clinical Measurements: Goal: Will remain free from infection Outcome: Progressing Goal: Diagnostic test results will improve Outcome: Progressing Goal: Cardiovascular complication will be avoided Outcome: Progressing   Problem: Activity: Goal: Risk for activity intolerance will decrease Outcome: Progressing   

## 2023-09-18 LAB — AEROBIC/ANAEROBIC CULTURE W GRAM STAIN (SURGICAL/DEEP WOUND): Culture: NO GROWTH

## 2024-08-03 ENCOUNTER — Other Ambulatory Visit: Payer: Self-pay

## 2024-08-03 ENCOUNTER — Encounter: Payer: Self-pay | Admitting: Gastroenterology

## 2024-08-03 NOTE — Anesthesia Preprocedure Evaluation (Signed)
 Anesthesia Evaluation  Patient identified by MRN, date of birth, ID band Patient awake    Reviewed: Allergy & Precautions, H&P , NPO status , Patient's Chart, lab work & pertinent test results  Airway Mallampati: II  TM Distance: >3 FB Neck ROM: Full    Dental no notable dental hx. (+) Upper Dentures, Lower Dentures   Pulmonary neg pulmonary ROS, sleep apnea , former smoker   Pulmonary exam normal breath sounds clear to auscultation       Cardiovascular negative cardio ROS Normal cardiovascular exam Rhythm:Regular Rate:Normal     Neuro/Psych negative neurological ROS  negative psych ROS   GI/Hepatic negative GI ROS, Neg liver ROS,,,  Endo/Other  negative endocrine ROS    Renal/GU negative Renal ROS  negative genitourinary   Musculoskeletal negative musculoskeletal ROS (+) Arthritis ,    Abdominal   Peds negative pediatric ROS (+)  Hematology negative hematology ROS (+)   Anesthesia Other Findings Motion sickness Wears dentures   Reproductive/Obstetrics negative OB ROS                              Anesthesia Physical Anesthesia Plan  ASA: 1  Anesthesia Plan: General   Post-op Pain Management:    Induction: Intravenous  PONV Risk Score and Plan:   Airway Management Planned: Natural Airway and Nasal Cannula  Additional Equipment:   Intra-op Plan:   Post-operative Plan:   Informed Consent: I have reviewed the patients History and Physical, chart, labs and discussed the procedure including the risks, benefits and alternatives for the proposed anesthesia with the patient or authorized representative who has indicated his/her understanding and acceptance.     Dental Advisory Given  Plan Discussed with: Anesthesiologist, CRNA and Surgeon  Anesthesia Plan Comments: (Patient consented for risks of anesthesia including but not limited to:  - adverse reactions to  medications - risk of airway placement if required - damage to eyes, teeth, lips or other oral mucosa - nerve damage due to positioning  - sore throat or hoarseness - Damage to heart, brain, nerves, lungs, other parts of body or loss of life  Patient voiced understanding and assent.)        Anesthesia Quick Evaluation

## 2024-08-07 ENCOUNTER — Encounter: Payer: Self-pay | Admitting: Gastroenterology

## 2024-08-07 ENCOUNTER — Encounter
Admission: RE | Disposition: A | Discharge status: Home / Self Care | Payer: Self-pay | Source: Home / Self Care | Attending: Gastroenterology

## 2024-08-07 ENCOUNTER — Ambulatory Visit
Admission: RE | Admit: 2024-08-07 | Discharge: 2024-08-07 | Disposition: A | Discharge status: Home / Self Care | Attending: Gastroenterology | Admitting: Gastroenterology

## 2024-08-07 ENCOUNTER — Other Ambulatory Visit: Payer: Self-pay

## 2024-08-07 ENCOUNTER — Ambulatory Visit: Payer: Self-pay | Admitting: Anesthesiology

## 2024-08-07 DIAGNOSIS — K5730 Diverticulosis of large intestine without perforation or abscess without bleeding: Secondary | ICD-10-CM | POA: Diagnosis not present

## 2024-08-07 DIAGNOSIS — G4730 Sleep apnea, unspecified: Secondary | ICD-10-CM | POA: Insufficient documentation

## 2024-08-07 DIAGNOSIS — Z1211 Encounter for screening for malignant neoplasm of colon: Secondary | ICD-10-CM | POA: Insufficient documentation

## 2024-08-07 DIAGNOSIS — Z87891 Personal history of nicotine dependence: Secondary | ICD-10-CM | POA: Insufficient documentation

## 2024-08-07 DIAGNOSIS — Z800 Family history of malignant neoplasm of digestive organs: Secondary | ICD-10-CM | POA: Diagnosis not present

## 2024-08-07 HISTORY — PX: COLONOSCOPY: SHX5424

## 2024-08-07 HISTORY — PX: POLYPECTOMY: SHX149

## 2024-08-07 SURGERY — COLONOSCOPY
Anesthesia: General

## 2024-08-07 MED ORDER — SODIUM CHLORIDE 0.9 % IV SOLN: INTRAVENOUS | Status: DC

## 2024-08-07 MED ORDER — STERILE WATER FOR IRRIGATION IR SOLN
Status: DC | PRN
  Administered 2024-08-07: 1

## 2024-08-07 MED ORDER — LACTATED RINGERS IV SOLN
INTRAVENOUS | Status: DC
Start: 2024-08-07 — End: 2024-08-07

## 2024-08-07 MED ORDER — PROPOFOL 10 MG/ML IV BOLUS
INTRAVENOUS | Status: DC | PRN
  Administered 2024-08-07: 40 mg via INTRAVENOUS
  Administered 2024-08-07: 30 mg via INTRAVENOUS
  Administered 2024-08-07: 100 mg via INTRAVENOUS
  Administered 2024-08-07: 30 mg via INTRAVENOUS

## 2024-08-07 MED ORDER — LIDOCAINE HCL (CARDIAC) PF 100 MG/5ML IV SOSY
PREFILLED_SYRINGE | INTRAVENOUS | Status: DC | PRN
  Administered 2024-08-07: 50 mg via INTRAVENOUS

## 2024-08-07 MED ORDER — LIDOCAINE HCL (PF) 2 % IJ SOLN
INTRAMUSCULAR | Status: AC
  Filled 2024-08-07: qty 5

## 2024-08-07 SURGICAL SUPPLY — 6 items
GOWN CVR UNV OPN BCK APRN NK (MISCELLANEOUS) ×4 IMPLANT
KIT PROCEDURE OLYMPUS (MISCELLANEOUS) ×2 IMPLANT
MANIFOLD NEPTUNE II (INSTRUMENTS) ×2 IMPLANT
SNARE COLD EXACTO (MISCELLANEOUS) IMPLANT
TRAP ETRAP POLY (MISCELLANEOUS) IMPLANT
WATER STERILE IRR 250ML POUR (IV SOLUTION) ×2 IMPLANT

## 2024-08-07 NOTE — Anesthesia Postprocedure Evaluation (Signed)
 Anesthesia Post Note  Patient: Harry Peters  Procedure(s) Performed: COLONOSCOPY POLYPECTOMY, INTESTINE  Patient location during evaluation: PACU Anesthesia Type: General Level of consciousness: awake and alert Pain management: pain level controlled Vital Signs Assessment: post-procedure vital signs reviewed and stable Respiratory status: spontaneous breathing, nonlabored ventilation, respiratory function stable and patient connected to nasal cannula oxygen Cardiovascular status: blood pressure returned to baseline and stable Postop Assessment: no apparent nausea or vomiting Anesthetic complications: no   No notable events documented.   Last Vitals:  Vitals:   08/07/24 1035 08/07/24 1041  BP:  116/82  Pulse: 67 64  Resp: 17 20  Temp:  36.5 C  SpO2: 95% 96%    Last Pain:  Vitals:   08/07/24 1041  TempSrc:   PainSc: 0-No pain                 Brandon Wiechman C Aaro Meyers

## 2024-08-07 NOTE — Op Note (Signed)
 [facility] Patient Name: Harry Peters Procedure Date: 08/07/2024 10:07 AM MRN: [id number] Account #: [id number] Date of Birth: 03-Feb-1961 Admit Type: Outpatient Age: 63 Room: [facility] OR ROOM 01 Gender: Male Note Status: Finalized Instrument Name: [id number] [id number] Procedure:             Colonoscopy Indications:           Screening for colorectal malignant neoplasm, Screening                         in patient at increased risk: Colorectal cancer in                         mother before age 78 Providers:             Ruel Kung MD, MD Referring MD:          Lynwood FALCON. Valora, MD (Referring MD) Medicines:             Monitored Anesthesia Care Complications:         No immediate complications. Procedure:             Pre-Anesthesia Assessment:                        - Prior to the procedure, a History and Physical was                         performed, and patient medications, allergies and                         sensitivities were reviewed. The patient's tolerance                         of previous anesthesia was reviewed.                        - The risks and benefits of the procedure and the                         sedation options and risks were discussed with the                         patient. All questions were answered and informed                         consent was obtained.                        - ASA Grade Assessment: II - A patient with mild                         systemic disease.                        After obtaining informed consent, the colonoscope was                         passed under direct vision. Throughout the procedure,                         the  patient's blood pressure, pulse, and oxygen                         saturations were monitored continuously. The                         Colonoscope was introduced through the anus and                         advanced to the the cecum, identified by the                          appendiceal orifice. The colonoscopy was performed                         with ease. The patient tolerated the procedure well.                         The quality of the bowel preparation was excellent.                         The ileocecal valve, appendiceal orifice, and rectum                         were photographed. Findings:      A 5 mm polyp was found in the ascending colon. The polyp was sessile.       The polyp was removed with a cold snare. Resection and retrieval were       complete.      The exam was otherwise without abnormality on direct and retroflexion       views.      Multiple medium-mouthed diverticula were found in the sigmoid colon. Impression:            - One 5 mm polyp in the ascending colon, removed with                         a cold snare. Resected and retrieved.                        - The examination was otherwise normal on direct and                         retroflexion views. Recommendation:        - Discharge patient to home (with escort).                        - Advance diet as tolerated.                        - Continue present medications.                        - Await pathology results.                        - Repeat colonoscopy in 5 years for surveillance based                         on pathology results. Procedure Code(s):     ---  Professional ---                        [phone], Colonoscopy, flexible; with removal of                         tumor(s), polyp(s), or other lesion(s) by snare                         technique Diagnosis Code(s):     --- Professional ---                        Z12.11, Encounter for screening for malignant neoplasm                         of colon                        Z80.0, Family history of malignant neoplasm of                         digestive organs                        D12.2, Benign neoplasm of ascending colon CPT copyright 2022 [employer]. All rights reserved. The codes documented  in this report are preliminary and upon coder review may  be revised to meet current compliance requirements. Ruel Kung, MD Ruel Kung MD, MD 08/07/2024 10:29:47 AM This report has been signed electronically. Number of Addenda: 0 Note Initiated On: 08/07/2024 10:07 AM Scope Withdrawal Time: 0 hours 9 minutes 9 seconds  Total Procedure Duration: 0 hours 11 minutes 41 seconds  Estimated Blood Loss:  Estimated blood loss: none.      [facility]
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
## 2024-08-07 NOTE — Transfer of Care (Signed)
 Immediate Anesthesia Transfer of Care Note  Patient: Harry Peters  Procedure(s) Performed: COLONOSCOPY POLYPECTOMY, INTESTINE  Patient Location: PACU  Anesthesia Type: General  Level of Consciousness: awake, alert  and patient cooperative  Airway and Oxygen Therapy: Patient Spontanous Breathing and Patient connected to supplemental oxygen  Post-op Assessment: Post-op Vital signs reviewed, Patient's Cardiovascular Status Stable, Respiratory Function Stable, Patent Airway and No signs of Nausea or vomiting  Post-op Vital Signs: Reviewed and stable  Complications: No notable events documented.

## 2024-08-11 LAB — SURGICAL PATHOLOGY
